# Patient Record
Sex: Female | Born: 1960 | Race: White | Hispanic: No | Marital: Married | Smoking: Never smoker
Health system: Southern US, Community
[De-identification: ages and names within clinical notes are randomized; demographics above are authoritative.]

## PROBLEM LIST (undated history)

## (undated) DIAGNOSIS — Z87898 Personal history of other specified conditions: Secondary | ICD-10-CM

## (undated) DIAGNOSIS — D219 Benign neoplasm of connective and other soft tissue, unspecified: Secondary | ICD-10-CM

## (undated) DIAGNOSIS — E079 Disorder of thyroid, unspecified: Secondary | ICD-10-CM

## (undated) DIAGNOSIS — R32 Unspecified urinary incontinence: Secondary | ICD-10-CM

## (undated) DIAGNOSIS — N393 Stress incontinence (female) (male): Secondary | ICD-10-CM

## (undated) DIAGNOSIS — E785 Hyperlipidemia, unspecified: Secondary | ICD-10-CM

## (undated) DIAGNOSIS — R7303 Prediabetes: Secondary | ICD-10-CM

## (undated) DIAGNOSIS — G9332 Myalgic encephalomyelitis/chronic fatigue syndrome: Secondary | ICD-10-CM

## (undated) DIAGNOSIS — R112 Nausea with vomiting, unspecified: Secondary | ICD-10-CM

## (undated) DIAGNOSIS — Z973 Presence of spectacles and contact lenses: Secondary | ICD-10-CM

## (undated) DIAGNOSIS — E7800 Pure hypercholesterolemia, unspecified: Secondary | ICD-10-CM

## (undated) DIAGNOSIS — E039 Hypothyroidism, unspecified: Secondary | ICD-10-CM

## (undated) DIAGNOSIS — K589 Irritable bowel syndrome without diarrhea: Secondary | ICD-10-CM

## (undated) DIAGNOSIS — M797 Fibromyalgia: Secondary | ICD-10-CM

## (undated) DIAGNOSIS — N8110 Cystocele, unspecified: Secondary | ICD-10-CM

## (undated) DIAGNOSIS — N8185 Cervical stump prolapse: Secondary | ICD-10-CM

## (undated) DIAGNOSIS — Z86018 Personal history of other benign neoplasm: Secondary | ICD-10-CM

## (undated) DIAGNOSIS — Z9889 Other specified postprocedural states: Secondary | ICD-10-CM

## (undated) DIAGNOSIS — N809 Endometriosis, unspecified: Secondary | ICD-10-CM

## (undated) DIAGNOSIS — R5382 Chronic fatigue, unspecified: Secondary | ICD-10-CM

## (undated) HISTORY — DX: Endometriosis, unspecified: N80.9

## (undated) HISTORY — PX: TUBAL LIGATION: SHX77

## (undated) HISTORY — DX: Pure hypercholesterolemia, unspecified: E78.00

## (undated) HISTORY — DX: Prediabetes: R73.03

## (undated) HISTORY — PX: HYSTEROSCOPY: SHX211

## (undated) HISTORY — PX: CHOLECYSTECTOMY: SHX55

## (undated) HISTORY — DX: Benign neoplasm of connective and other soft tissue, unspecified: D21.9

## (undated) HISTORY — DX: Unspecified urinary incontinence: R32

## (undated) HISTORY — PX: ABDOMINAL HYSTERECTOMY: SHX81

---

## 1988-05-02 HISTORY — PX: LAPAROSCOPIC TUBAL LIGATION: SHX1937

## 1988-05-02 HISTORY — PX: TUBAL LIGATION: SHX77

## 1992-05-02 HISTORY — PX: LAPAROSCOPIC CHOLECYSTECTOMY: SUR755

## 2002-05-02 HISTORY — PX: BILATERAL SALPINGECTOMY: SHX5743

## 2002-05-02 HISTORY — PX: SALPINGECTOMY: SHX328

## 2003-01-15 HISTORY — PX: PELVIC LAPAROSCOPY: SHX162

## 2005-05-02 HISTORY — PX: OTHER SURGICAL HISTORY: SHX169

## 2005-05-02 HISTORY — PX: DILATION AND CURETTAGE OF UTERUS: SHX78

## 2005-05-02 HISTORY — PX: APPENDECTOMY: SHX54

## 2005-05-02 HISTORY — PX: ENDOMETRIAL ABLATION: SHX621

## 2005-07-29 HISTORY — PX: HYSTEROSCOPY WITH NOVASURE: SHX5574

## 2005-07-29 HISTORY — PX: ENDOMETRIAL ABLATION: SHX621

## 2007-05-03 HISTORY — PX: LAPAROSCOPIC SUPRACERVICAL HYSTERECTOMY: SUR797

## 2007-05-03 HISTORY — PX: PELVIC LAPAROSCOPY: SHX162

## 2007-05-03 HISTORY — PX: ABDOMINAL HYSTERECTOMY: SHX81

## 2007-08-17 HISTORY — PX: DILATATION & CURETTAGE/HYSTEROSCOPY WITH MYOSURE: SHX6511

## 2007-08-17 HISTORY — PX: OTHER SURGICAL HISTORY: SHX169

## 2007-12-21 HISTORY — PX: LAPAROSCOPIC SUPRACERVICAL HYSTERECTOMY: SUR797

## 2012-06-02 HISTORY — PX: BREAST SURGERY: SHX581

## 2012-06-02 HISTORY — PX: BREAST REDUCTION SURGERY: SHX8

## 2018-04-02 ENCOUNTER — Other Ambulatory Visit: Payer: Self-pay

## 2018-04-02 ENCOUNTER — Emergency Department (HOSPITAL_COMMUNITY)
Admission: EM | Admit: 2018-04-02 | Discharge: 2018-04-02 | Disposition: A | Discharge status: Home / Self Care | Payer: BLUE CROSS/BLUE SHIELD | Attending: Emergency Medicine | Admitting: Emergency Medicine

## 2018-04-02 ENCOUNTER — Emergency Department (HOSPITAL_COMMUNITY): Payer: BLUE CROSS/BLUE SHIELD

## 2018-04-02 ENCOUNTER — Encounter (HOSPITAL_COMMUNITY): Payer: Self-pay | Admitting: *Deleted

## 2018-04-02 DIAGNOSIS — W010XXA Fall on same level from slipping, tripping and stumbling without subsequent striking against object, initial encounter: Secondary | ICD-10-CM | POA: Diagnosis not present

## 2018-04-02 DIAGNOSIS — Y9301 Activity, walking, marching and hiking: Secondary | ICD-10-CM | POA: Diagnosis not present

## 2018-04-02 DIAGNOSIS — Y999 Unspecified external cause status: Secondary | ICD-10-CM | POA: Diagnosis not present

## 2018-04-02 DIAGNOSIS — Y929 Unspecified place or not applicable: Secondary | ICD-10-CM | POA: Diagnosis not present

## 2018-04-02 DIAGNOSIS — S8002XA Contusion of left knee, initial encounter: Secondary | ICD-10-CM | POA: Diagnosis not present

## 2018-04-02 DIAGNOSIS — S8992XA Unspecified injury of left lower leg, initial encounter: Secondary | ICD-10-CM | POA: Diagnosis present

## 2018-04-02 MED ORDER — NAPROXEN 500 MG PO TABS: 500.0000 mg | ORAL_TABLET | Freq: Two times a day (BID) | ORAL | 0 refills | Status: DC

## 2018-04-02 NOTE — ED Triage Notes (Signed)
Patient fell last Wednesday tripping over a brick in the garage falling left knee cap. Patient feels she keeps injuring the left knee.  Patient has noted increased swelling today.

## 2018-04-02 NOTE — Discharge Instructions (Signed)
Your x-ray showed no signs of broken bones, you may use the knee immobilizer as needed for comfort, please take the Naprosyn as prescribed, ice, elevate and follow-up within 1 week at the family doctor's office.

## 2018-04-02 NOTE — ED Provider Notes (Signed)
[facility] EMERGENCY DEPARTMENT Provider Note   CSN: [id number] Arrival date & time: 04/02/18  2107     History   Chief Complaint Chief Complaint  Patient presents with  . Fall    HPI Valerie Vasquez is a 57 y.o. female.  HPI  The patient is a pleasant 57 year old female, history of hypothyroidism, she presents to the hospital approximately 5 days after having a fall where she accidentally tripped and landed on her left patella, she had acute onset of pain but has been ambulatory on this area since then.  She has had a couple of other minor injuries to the area since and is worried that it may be broken.  She is still walking on it but is noting some swelling, some bruising, denies any other injuries.  Symptoms are persistent, gradually worsening  History reviewed. No pertinent past medical history.  There are no active problems to display for this patient.   History reviewed. No pertinent surgical history.   OB History   None      Home Medications    Prior to Admission medications   Medication Sig Start Date End Date Taking? Authorizing Provider  naproxen (NAPROSYN) 500 MG tablet Take 1 tablet (500 mg total) by mouth 2 (two) times daily with a meal. 04/02/18   Noemi Chapel, MD    Family History History reviewed. No pertinent family history.  Social History Social History   Tobacco Use  . Smoking status: Never Smoker  . Smokeless tobacco: Never Used  Substance Use Topics  . Alcohol use: Not Currently  . Drug use: Never     Allergies   Codeine; Diazepam; and Morphine   Review of Systems Review of Systems  Skin: Negative for wound.  Neurological: Negative for weakness and numbness.     Physical Exam Updated Vital Signs BP (!) 145/90 (BP Location: Right Arm)   Pulse 82   Temp 98.1 F (36.7 C) (Oral)   Resp 12   Ht 1.549 m (5\' 1" )   Wt 87.1 kg   SpO2 99%   BMI 36.28 kg/m   Physical Exam  Constitutional: She appears well-developed and  well-nourished. No distress.  HENT:  Head: Normocephalic and atraumatic.  Mouth/Throat: Oropharynx is clear and moist. No oropharyngeal exudate.  Eyes: Pupils are equal, round, and reactive to light. Conjunctivae and EOM are normal. Right eye exhibits no discharge. Left eye exhibits no discharge. No scleral icterus.  Neck: Normal range of motion. Neck supple. No JVD present. No thyromegaly present.  Cardiovascular: Normal rate, regular rhythm, normal heart sounds and intact distal pulses. Exam reveals no gallop and no friction rub.  No murmur heard. Pulmonary/Chest: Effort normal and breath sounds normal. No respiratory distress. She has no wheezes. She has no rales.  Abdominal: Soft. Bowel sounds are normal. She exhibits no distension and no mass. There is no tenderness.  Musculoskeletal: Normal range of motion. She exhibits tenderness. She exhibits no edema.  The patient is able to fully flex and extend at the left knee, there is some tenderness over the distal inferior patella and the patellar tendon but she is able to straight leg raise without any difficulty.  She can bear weight, she has good stability of the knee without any anterior or posterior laxity.  Lymphadenopathy:    She has no cervical adenopathy.  Neurological: She is alert. Coordination normal.  Skin: Skin is warm and dry. No rash noted. No erythema.  Psychiatric: She has a normal mood and affect.  Her behavior is normal.  Nursing note and vitals reviewed.    ED Treatments / Results  Labs (all labs ordered are listed, but only abnormal results are displayed) Labs Reviewed - No data to display  EKG None  Radiology Dg Knee Complete 4 Views Left  Result Date: 04/02/2018 CLINICAL DATA:  Left knee pain after fall last week. EXAM: LEFT KNEE - COMPLETE 4+ VIEW COMPARISON:  None. FINDINGS: No evidence of fracture, dislocation, or joint effusion. No evidence of arthropathy or other focal bone abnormality. Soft tissues are  unremarkable. IMPRESSION: Negative. Electronically Signed   By: Marijo Conception, M.D.   On: 04/02/2018 21:48    Procedures Procedures (including critical care time)  Medications Ordered in ED Medications - No data to display   Initial Impression / Assessment and Plan / ED Course  I have reviewed the triage vital signs and the nursing notes.  Pertinent labs & imaging results that were available during my care of the patient were reviewed by me and considered in my medical decision making (see chart for details).     The patient is suffered a contusion of her knee, there does not appear to be any acute fractures on my interpretation of the x-ray.  I have personally looked at these images.  Radiology pending, recommended NSAIDs ice and knee immobilizer, patient agreeable.  She [facility]  Final Clinical Impressions(s) / ED Diagnoses   Final diagnoses:  Contusion of left knee, initial encounter    ED Discharge Orders         Ordered    naproxen (NAPROSYN) 500 MG tablet  2 times daily with meals     04/02/18 2153           Noemi Chapel, MD 04/02/18 2154

## 2018-06-27 ENCOUNTER — Other Ambulatory Visit: Payer: Self-pay

## 2018-06-27 ENCOUNTER — Encounter (HOSPITAL_COMMUNITY): Payer: Self-pay

## 2018-06-27 DIAGNOSIS — L03211 Cellulitis of face: Secondary | ICD-10-CM | POA: Diagnosis not present

## 2018-06-27 DIAGNOSIS — Z9049 Acquired absence of other specified parts of digestive tract: Secondary | ICD-10-CM | POA: Insufficient documentation

## 2018-06-27 DIAGNOSIS — R220 Localized swelling, mass and lump, head: Secondary | ICD-10-CM | POA: Diagnosis present

## 2018-06-27 DIAGNOSIS — Z79899 Other long term (current) drug therapy: Secondary | ICD-10-CM | POA: Diagnosis not present

## 2018-06-27 NOTE — ED Triage Notes (Signed)
Pt with complaints of facial swelling on left side of mouth. Pt with sore on left side of the mouth. Pt states came up this morning. Pt denies any fever.

## 2018-06-28 ENCOUNTER — Emergency Department (HOSPITAL_COMMUNITY)
Admission: EM | Admit: 2018-06-28 | Discharge: 2018-06-28 | Disposition: A | Discharge status: Home / Self Care | Payer: BLUE CROSS/BLUE SHIELD | Attending: Emergency Medicine | Admitting: Emergency Medicine

## 2018-06-28 ENCOUNTER — Emergency Department (HOSPITAL_COMMUNITY): Payer: BLUE CROSS/BLUE SHIELD

## 2018-06-28 DIAGNOSIS — L03211 Cellulitis of face: Secondary | ICD-10-CM

## 2018-06-28 HISTORY — DX: Chronic fatigue, unspecified: R53.82

## 2018-06-28 HISTORY — DX: Irritable bowel syndrome, unspecified: K58.9

## 2018-06-28 HISTORY — DX: Myalgic encephalomyelitis/chronic fatigue syndrome: G93.32

## 2018-06-28 HISTORY — DX: Disorder of thyroid, unspecified: E07.9

## 2018-06-28 HISTORY — DX: Fibromyalgia: M79.7

## 2018-06-28 LAB — BASIC METABOLIC PANEL
Anion gap: 9 (ref 5–15)
BUN: 8 mg/dL (ref 6–20)
CHLORIDE: 105 mmol/L (ref 98–111)
CO2: 24 mmol/L (ref 22–32)
Calcium: 8.9 mg/dL (ref 8.9–10.3)
Creatinine, Ser: 0.8 mg/dL (ref 0.44–1.00)
GFR calc Af Amer: >60 mL/min (ref >60)
GFR calc non Af Amer: >60 mL/min (ref >60)
Glucose, Bld: 127 mg/dL — ABNORMAL HIGH (ref 70–99)
Potassium: 3.4 mmol/L — ABNORMAL LOW (ref 3.5–5.1)
Sodium: 138 mmol/L (ref 135–145)

## 2018-06-28 LAB — CBC WITH DIFFERENTIAL/PLATELET
Abs Immature Granulocytes: 0.09 10*3/uL — ABNORMAL HIGH (ref 0.00–0.07)
Basophils Absolute: 0 10*3/uL (ref 0.0–0.1)
Basophils Relative: 0 %
Eosinophils Absolute: 0 10*3/uL (ref 0.0–0.5)
Eosinophils Relative: 0 %
HCT: 44.5 % (ref 36.0–46.0)
Hemoglobin: 14.3 g/dL (ref 12.0–15.0)
Immature Granulocytes: 0 %
LYMPHS ABS: 2.6 10*3/uL (ref 0.7–4.0)
Lymphocytes Relative: 13 %
MCH: 28.6 pg (ref 26.0–34.0)
MCHC: 32.1 g/dL (ref 30.0–36.0)
MCV: 89 fL (ref 80.0–100.0)
MONOS PCT: 4 %
Monocytes Absolute: 0.9 10*3/uL (ref 0.1–1.0)
Neutro Abs: 17 10*3/uL — ABNORMAL HIGH (ref 1.7–7.7)
Neutrophils Relative %: 83 %
Platelets: 290 10*3/uL (ref 150–400)
RBC: 5 MIL/uL (ref 3.87–5.11)
RDW: 12.3 % (ref 11.5–15.5)
WBC: 20.7 10*3/uL — ABNORMAL HIGH (ref 4.0–10.5)
nRBC: 0 % (ref 0.0–0.2)

## 2018-06-28 LAB — LACTIC ACID, PLASMA: Lactic Acid, Venous: 0.9 mmol/L (ref 0.5–1.9)

## 2018-06-28 MED ORDER — ONDANSETRON HCL 4 MG/2ML IJ SOLN
4.0000 mg | Freq: Once | INTRAMUSCULAR | Status: AC
  Administered 2018-06-28: 4 mg via INTRAVENOUS
  Filled 2018-06-28: qty 2

## 2018-06-28 MED ORDER — OXYCODONE-ACETAMINOPHEN 5-325 MG PO TABS: 2.0000 | ORAL_TABLET | ORAL | 0 refills | Status: DC | PRN

## 2018-06-28 MED ORDER — CLINDAMYCIN PHOSPHATE 900 MG/50ML IV SOLN
900.0000 mg | Freq: Once | INTRAVENOUS | Status: AC
  Administered 2018-06-28: 900 mg via INTRAVENOUS
  Filled 2018-06-28: qty 50

## 2018-06-28 MED ORDER — FENTANYL CITRATE (PF) 100 MCG/2ML IJ SOLN
100.0000 ug | Freq: Once | INTRAMUSCULAR | Status: AC
  Administered 2018-06-28: 100 ug via INTRAVENOUS
  Filled 2018-06-28: qty 2

## 2018-06-28 MED ORDER — LIDOCAINE HCL (PF) 2 % IJ SOLN
INTRAMUSCULAR | Status: AC
  Filled 2018-06-28: qty 20

## 2018-06-28 MED ORDER — SODIUM CHLORIDE 0.9 % IV BOLUS
500.0000 mL | Freq: Once | INTRAVENOUS | Status: AC
  Administered 2018-06-28: 500 mL via INTRAVENOUS

## 2018-06-28 MED ORDER — IOHEXOL 300 MG/ML  SOLN
75.0000 mL | Freq: Once | INTRAMUSCULAR | Status: AC | PRN
  Administered 2018-06-28: 75 mL via INTRAVENOUS

## 2018-06-28 MED ORDER — KETOROLAC TROMETHAMINE 30 MG/ML IJ SOLN
15.0000 mg | Freq: Once | INTRAMUSCULAR | Status: AC
  Administered 2018-06-28: 15 mg via INTRAVENOUS
  Filled 2018-06-28: qty 1

## 2018-06-28 MED ORDER — CLINDAMYCIN HCL 150 MG PO CAPS: 300.0000 mg | ORAL_CAPSULE | Freq: Four times a day (QID) | ORAL | 0 refills | Status: DC

## 2018-06-28 MED ORDER — LIDOCAINE HCL (PF) 2 % IJ SOLN: 5.0000 mL | Freq: Once | INTRAMUSCULAR | Status: DC

## 2018-06-28 MED ORDER — OXYCODONE-ACETAMINOPHEN 5-325 MG PO TABS
1.0000 | ORAL_TABLET | Freq: Once | ORAL | Status: AC
  Administered 2018-06-28: 1 via ORAL
  Filled 2018-06-28: qty 1

## 2018-06-28 NOTE — Discharge Instructions (Signed)
Take the antibiotic as prescribed.  Return to the ER if symptoms are not improving after 24 to 36 hours.  Return to the ER at any time if you develop a high fever or significant worsening of swelling and redness, especially if it goes near the eye.

## 2018-06-28 NOTE — ED Notes (Signed)
Pt ambulatory to waiting room. Pt verbalized understanding of discharge instructions.   

## 2018-06-28 NOTE — ED Provider Notes (Signed)
[facility] EMERGENCY DEPARTMENT Provider Note   CSN: [id number] Arrival date & time: 06/27/18  1818    History   Chief Complaint Chief Complaint  Patient presents with  . Facial Swelling    HPI Valerie Vasquez is a 58 y.o. female.     Patient presents to the emergency department for evaluation of left-sided facial swelling.  Patient reports that she noticed the blindness next to the corner of her mouth yesterday.  She did try to squeeze it at some point and since then the area has become red and swollen, causing her a great deal of pain.  Nothing has drained.  She has not had any fever.     Past Medical History:  Diagnosis Date  . Chronic fatigue syndrome   . Fibromyalgia   . IBS (irritable bowel syndrome)   . Thyroid disease     There are no active problems to display for this patient.   Past Surgical History:  Procedure Laterality Date  . ABDOMINAL HYSTERECTOMY    . CHOLECYSTECTOMY    . HYSTEROSCOPY    . TUBAL LIGATION       OB History   No obstetric history on file.      Home Medications    Prior to Admission medications   Medication Sig Start Date End Date Taking? Authorizing Provider  clindamycin (CLEOCIN) 150 MG capsule Take 2 capsules (300 mg total) by mouth 4 (four) times daily. 06/28/18   Orpah Greek, MD  naproxen (NAPROSYN) 500 MG tablet Take 1 tablet (500 mg total) by mouth 2 (two) times daily with a meal. 04/02/18   Noemi Chapel, MD  oxyCODONE-acetaminophen (PERCOCET) 5-325 MG tablet Take 2 tablets by mouth every 4 (four) hours as needed. 06/28/18   Orpah Greek, MD    Family History No family history on file.  Social History Social History   Tobacco Use  . Smoking status: Never Smoker  . Smokeless tobacco: Never Used  Substance Use Topics  . Alcohol use: Not Currently  . Drug use: Never     Allergies   Codeine; Diazepam; and Morphine   Review of Systems Review of Systems  Skin: Positive for wound.  All other  systems reviewed and are negative.    Physical Exam Updated Vital Signs BP 114/73 (BP Location: Right Arm)   Pulse 92   Temp 98.5 F (36.9 C) (Temporal)   Resp 16   Ht 5\' 1"  (1.549 m)   Wt 87.5 kg   SpO2 98%   BMI 36.47 kg/m   Physical Exam Vitals signs and nursing note reviewed.  Constitutional:      General: She is not in acute distress.    Appearance: Normal appearance. She is well-developed.  HENT:     Head: Normocephalic.     Right Ear: Hearing normal.     Left Ear: Hearing normal.     Nose: Nose normal.  Eyes:     Conjunctiva/sclera: Conjunctivae normal.     Pupils: Pupils are equal, round, and reactive to light.  Neck:     Musculoskeletal: Normal range of motion and neck supple.  Cardiovascular:     Rate and Rhythm: Regular rhythm.     Heart sounds: S1 normal and S2 normal. No murmur. No friction rub. No gallop.   Pulmonary:     Effort: Pulmonary effort is normal. No respiratory distress.     Breath sounds: Normal breath sounds.  Chest:     Chest wall: No tenderness.  Abdominal:     General: Bowel sounds are normal.     Palpations: Abdomen is soft.     Tenderness: There is no abdominal tenderness. There is no guarding or rebound. Negative signs include Murphy's sign and McBurney's sign.     Hernia: No hernia is present.  Musculoskeletal: Normal range of motion.  Skin:    General: Skin is warm and dry.     Findings: Erythema present. No rash.     Comments: Small scab next to the left corner of her mouth with surrounding erythema, induration, tenderness.  No fluctuance noted.  Neurological:     Mental Status: She is alert and oriented to person, place, and time.     GCS: GCS eye subscore is 4. GCS verbal subscore is 5. GCS motor subscore is 6.     Cranial Nerves: No cranial nerve deficit.     Sensory: No sensory deficit.     Coordination: Coordination normal.  Psychiatric:        Speech: Speech normal.        Behavior: Behavior normal.        Thought  Content: Thought content normal.      ED Treatments / Results  Labs (all labs ordered are listed, but only abnormal results are displayed) Labs Reviewed  CBC WITH DIFFERENTIAL/PLATELET - Abnormal; Notable for the following components:      Result Value   WBC 20.7 (*)    Neutro Abs 17.0 (*)    Abs Immature Granulocytes 0.09 (*)    All other components within normal limits  BASIC METABOLIC PANEL - Abnormal; Notable for the following components:   Potassium 3.4 (*)    Glucose, Bld 127 (*)    All other components within normal limits  LACTIC ACID, PLASMA    EKG None  Radiology Ct Maxillofacial W Contrast  Result Date: 06/28/2018 CLINICAL DATA:  LEFT facial swelling, mouth sore. EXAM: CT MAXILLOFACIAL WITH CONTRAST TECHNIQUE: Multidetector CT imaging of the maxillofacial structures was performed with intravenous contrast. Multiplanar CT image reconstructions were also generated. CONTRAST:  69mL OMNIPAQUE IOHEXOL 300 MG/ML  SOLN COMPARISON:  None. FINDINGS: OSSEOUS: No acute facial fracture. The mandible is intact, the condyles are located. No destructive bony lesions. No CT findings of acute dental pathology. Severe atlantodental osteoarthrosis. ORBITS: Ocular globes and orbital contents are normal. SINUSES: Paranasal sinuses are well aerated. Intact nasal septum is midline. Mastoid aircells are well aerated. SOFT TISSUES: LEFT premalar/periorbital soft tissue swelling with subcutaneous fat stranding, extending to lower face. 7 x 13 x 15 mm superficial RIGHT enhance and fluid collection standing to skin surface at the level of the mandible body. Thickened LEFT platysma and subcutaneous fat stranding extending to upper neck. LIMITED INTRACRANIAL: Empty sella. IMPRESSION: 1. 7 x 13 x 15 mm abscess within the superficial soft tissues of the LEFT lower face. LEFT facial cellulitis extending to the periorbital soft tissues without postseptal extent. Electronically Signed   By: Elon Alas  M.D.   On: 06/28/2018 04:17    Procedures .Marland KitchenIncision and Drainage Date/Time: 06/28/2018 7:08 AM Performed by: Orpah Greek, MD Authorized by: Orpah Greek, MD   Consent:    Consent obtained:  Verbal   Consent given by:  Patient   Risks discussed:  Bleeding, incomplete drainage and pain Universal protocol:    Procedure explained and questions answered to patient or proxy's satisfaction: yes     Site/side marked: yes     Immediately prior to procedure a time  out was called: yes     Patient identity confirmed:  Verbally with patient Location:    Type:  Abscess   Size:  1cm   Location:  Head   Head location:  Face Pre-procedure details:    Skin preparation:  Betadine and antiseptic wash Anesthesia (see MAR for exact dosages):    Anesthesia method:  Local infiltration   Local anesthetic:  Lidocaine 1% w/o epi Procedure type:    Complexity:  Simple Procedure details:    Needle aspiration: yes   Post-procedure details:    Patient tolerance of procedure:  Tolerated well, no immediate complications Comments:     No pus encountered with needle aspiration, no incision made   (including critical care time)  Medications Ordered in ED Medications  lidocaine (XYLOCAINE) 2 % injection 5 mL (has no administration in time range)  ketorolac (TORADOL) 30 MG/ML injection 15 mg (15 mg Intravenous Given 06/28/18 0233)  sodium chloride 0.9 % bolus 500 mL (0 mLs Intravenous Stopped 06/28/18 0550)  clindamycin (CLEOCIN) IVPB 900 mg (0 mg Intravenous Stopped 06/28/18 0336)  fentaNYL (SUBLIMAZE) injection 100 mcg (100 mcg Intravenous Given 06/28/18 0233)  ondansetron (ZOFRAN) injection 4 mg (4 mg Intravenous Given 06/28/18 0234)  iohexol (OMNIPAQUE) 300 MG/ML solution 75 mL (75 mLs Intravenous Contrast Given 06/28/18 0347)  oxyCODONE-acetaminophen (PERCOCET/ROXICET) 5-325 MG per tablet 1 tablet (1 tablet Oral Given 06/28/18 0557)     Initial Impression / Assessment and Plan / ED  Course  I have reviewed the triage vital signs and the nursing notes.  Pertinent labs & imaging results that were available during my care of the patient were reviewed by me and considered in my medical decision making (see chart for details).        Patient presents to the emergency department for evaluation of facial swelling.  Symptoms began 1 day ago.  She has moderate amount of erythema with induration noted on the left perioral area and malar area.  CT scan does show a very small area of possible fluid collection, less than 1 cm in diameter.  Attempts to aspirate with a needle were unsuccessful.  Patient administered IV clindamycin.  Feel the patient can be treated as an outpatient with oral antibiotics but was given instructions to return to the ER immediately if she has any significant worsening for admission and continued IV antibiotics.  Final Clinical Impressions(s) / ED Diagnoses   Final diagnoses:  Facial cellulitis    ED Discharge Orders         Ordered    clindamycin (CLEOCIN) 150 MG capsule  4 times daily,   Status:  Discontinued     06/28/18 0530    oxyCODONE-acetaminophen (PERCOCET) 5-325 MG tablet  Every 4 hours PRN,   Status:  Discontinued     06/28/18 0530    oxyCODONE-acetaminophen (PERCOCET) 5-325 MG tablet  Every 4 hours PRN     06/28/18 0531    clindamycin (CLEOCIN) 150 MG capsule  4 times daily     06/28/18 0531           Orpah Greek, MD 06/28/18 [id number]

## 2019-10-28 ENCOUNTER — Encounter: Payer: Self-pay | Admitting: Obstetrics and Gynecology

## 2019-10-28 ENCOUNTER — Other Ambulatory Visit: Payer: Self-pay | Admitting: Obstetrics and Gynecology

## 2019-11-07 ENCOUNTER — Other Ambulatory Visit (HOSPITAL_COMMUNITY)
Admission: RE | Admit: 2019-11-07 | Discharge: 2019-11-07 | Disposition: A | Discharge status: Home / Self Care | Payer: 59 | Source: Ambulatory Visit | Attending: Obstetrics and Gynecology | Admitting: Obstetrics and Gynecology

## 2019-11-07 ENCOUNTER — Ambulatory Visit: Payer: 59 | Admitting: Obstetrics and Gynecology

## 2019-11-07 ENCOUNTER — Encounter: Payer: Self-pay | Admitting: Obstetrics and Gynecology

## 2019-11-07 ENCOUNTER — Other Ambulatory Visit: Payer: Self-pay

## 2019-11-07 VITALS — BP 138/80 | HR 64 | Resp 16 | Ht 62.0 in | Wt 166.0 lb

## 2019-11-07 DIAGNOSIS — Z01419 Encounter for gynecological examination (general) (routine) without abnormal findings: Secondary | ICD-10-CM | POA: Diagnosis not present

## 2019-11-07 NOTE — Progress Notes (Signed)
59 y.o. G26P2002 Married Caucasian female here for annual exam.    Had abnormal thermogram.  Bilateral breasts are always tender.   She has some chest pains.  Reports muscle spasms of her chest wall and her internal throat.   Taking ERT, compounded.   States her bladder has fallen.  Some difficulty controlling her bladder.  Able to void adequately. Difficulty to make it to the bathroom sometimes.   Patient has been to a hormone and fibromyalgia center in [place], [place] but is now living in [place].  Needs to establish care here.   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   PCP:  None   No LMP recorded. Patient has had a hysterectomy.           Sexually active: Yes.    The current method of family planning is status post hysterectomy.    Exercising: Yes.    Treadmill 2-3x/week Smoker:  no  Health Maintenance: Pap:  2017 normal History of abnormal Pap: Yes, hx of cryotherapy in the 90's--paps normal since MMG: 10/2015 normal in [place] pt. Has appt.next week for MMG--pt. States she has been doing tomography for past 3 years--LAST ONE ABNORMAL.  She has an appointment at [facility].  Colonoscopy:  NEVER BMD: ?years ago she thinks  Result ?Osteopenia TDaP: 11/2004--Pt. declines Gardasil:   no HIV:no Hep C:no Screening Labs:  -------     reports that she has never smoked. She has never used smokeless tobacco. She reports previous alcohol use. She reports that she does not use drugs.  Past Medical History:  Diagnosis Date  . Chronic fatigue syndrome   . Endometriosis   . Fibroid   . Fibromyalgia   . IBS (irritable bowel syndrome)   . Thyroid disease    Hypothyroidism  . Urinary incontinence     Past Surgical History:  Procedure Laterality Date  . ABDOMINAL HYSTERECTOMY  2009   [facility]--ovaries removed--[place]  . APPENDECTOMY  2007   at same time of D & C   . BILATERAL SALPINGECTOMY  2004   [place]--"full of blood"  . bowel ressection  2007   along with D & C, hysteroscopy and  appendectomy--[place]  . BREAST SURGERY  06/2012   breast reduction--[place]  . CHOLECYSTECTOMY    . DILATION AND CURETTAGE OF UTERUS  2007   with hysteroscopy--[place]  . ENDOMETRIAL ABLATION  2007  . HYSTEROSCOPY    . PELVIC LAPAROSCOPY  2009   removed fibroid, cleared fluid from uterus--[place]  . TUBAL LIGATION  1990   [place]    Current Outpatient Medications  Medication Sig Dispense Refill  . ARMOUR THYROID 30 MG tablet Take 30 mg by mouth daily.    . cyclobenzaprine (FLEXERIL) 10 MG tablet Take 10 mg by mouth 3 (three) times daily as needed for muscle spasms.    . metaxalone (SKELAXIN) 800 MG tablet Take 1 tablet by mouth as needed.    . NONFORMULARY OR COMPOUNDED ITEM Bi-Est 80/20 cream 1mg /41ml combined with Progesterone 191m 30mg   Apply 6 nights per week    . NONFORMULARY OR COMPOUNDED ITEM Liothyronine SR 4mcg Takes daily    . oxyCODONE-acetaminophen (PERCOCET) 5-325 MG tablet Take 2 tablets by mouth every 4 (four) hours as needed. 10 tablet 0  . sertraline (ZOLOFT) 50 MG tablet Take 1 tablet by mouth daily.    . SOOLANTRA 1 % CREA Apply 1 application topically as needed.     No current facility-administered medications for this visit.    Family History  Problem Relation Age of Onset  . Hypertension Mother   . Atrial fibrillation Mother   . Hypertension Brother   . Stroke Maternal Grandmother   . Diabetes Paternal Grandmother     Review of Systems  All other systems reviewed and are negative.   Exam:   BP 138/80   Pulse 64   Resp 16   Ht 5\' 2"  (1.575 m)   Wt 166 lb (75.3 kg)   BMI 30.36 kg/m     General appearance: alert, cooperative and appears stated age Head: normocephalic, without obvious abnormality, atraumatic Neck: no adenopathy, supple, symmetrical, trachea midline and thyroid normal to inspection and palpation Lungs: clear to auscultation bilaterally Breasts: consistent with prior surgery, no masses or tenderness, No nipple retraction or dimpling, No nipple discharge  or bleeding, No axillary adenopathy Heart: regular rate and rhythm Abdomen: soft, non-tender; no masses, no organomegaly Extremities: extremities normal, atraumatic, no cyanosis or edema Skin: skin color, texture, turgor normal. No rashes or lesions Lymph nodes: cervical, supraclavicular, and axillary nodes normal. Neurologic: grossly normal  Pelvic: External genitalia:  no lesions              No abnormal inguinal nodes palpated.              Urethra:  normal appearing urethra with no masses, tenderness or lesions              Bartholins and Skenes: normal                 Vagina: normal appearing vagina with normal color and discharge, no lesions              Cervix: no lesions              Pap taken: Yes.   Bimanual Exam:  Uterus:   Uterus absent.  First degree cervical prolapse.  Good bladder and posterior vaginal wall support.               Adnexa: no mass, fullness, tenderness              Rectal exam: Yes.  .  Confirms.              Anus:  normal sphincter tone, no lesions  Chaperone was present for exam.  Assessment:   Well woman visit with normal exam. Status post supracervical hysterectomy/BSO Status post bowel resection.  Remote hx of abnormal paps and cryotherapy.  Possible osteopenia.  ERT.  Hx abnormal thermogram.   Plan: Mammogram screening discussed.  [place] for screening mammogram at [place].  Self breast awareness reviewed. Pap and HR HPV as above. Guidelines for Calcium, Vitamin D, regular exercise program including cardiovascular and weight bearing exercise. She declines colon cancer screening. We discussed CDC as a resource for her if she has vaccine questions.  She may pursue care for her hormones at [facility].  She will also establish care with a PCP.  Will get op reports and pathology reports of her hysterectomy, endometrial ablation, and bowel resection surgery.  Follow up annually and prn.   After visit summary provided.

## 2019-11-07 NOTE — Patient Instructions (Signed)

## 2019-11-08 LAB — CYTOLOGY - PAP
Comment: NEGATIVE
Diagnosis: NEGATIVE
High risk HPV: NEGATIVE

## 2019-11-10 ENCOUNTER — Other Ambulatory Visit: Payer: Self-pay

## 2019-11-10 ENCOUNTER — Encounter (HOSPITAL_COMMUNITY): Payer: Self-pay | Admitting: Emergency Medicine

## 2019-11-10 ENCOUNTER — Emergency Department (HOSPITAL_COMMUNITY)
Admission: EM | Admit: 2019-11-10 | Discharge: 2019-11-11 | Disposition: A | Discharge status: Home / Self Care | Payer: 59 | Attending: Emergency Medicine | Admitting: Emergency Medicine

## 2019-11-10 ENCOUNTER — Emergency Department (HOSPITAL_COMMUNITY): Payer: 59

## 2019-11-10 DIAGNOSIS — E039 Hypothyroidism, unspecified: Secondary | ICD-10-CM | POA: Insufficient documentation

## 2019-11-10 DIAGNOSIS — R0789 Other chest pain: Secondary | ICD-10-CM | POA: Insufficient documentation

## 2019-11-10 DIAGNOSIS — Z79899 Other long term (current) drug therapy: Secondary | ICD-10-CM | POA: Diagnosis not present

## 2019-11-10 LAB — CBC
HCT: 41.4 % (ref 36.0–46.0)
Hemoglobin: 13.8 g/dL (ref 12.0–15.0)
MCH: 30.5 pg (ref 26.0–34.0)
MCHC: 33.3 g/dL (ref 30.0–36.0)
MCV: 91.4 fL (ref 80.0–100.0)
Platelets: 286 10*3/uL (ref 150–400)
RBC: 4.53 MIL/uL (ref 3.87–5.11)
RDW: 11.9 % (ref 11.5–15.5)
WBC: 5.4 10*3/uL (ref 4.0–10.5)
nRBC: 0 % (ref 0.0–0.2)

## 2019-11-10 LAB — URINALYSIS, ROUTINE W REFLEX MICROSCOPIC
Bacteria, UA: NONE SEEN
Bilirubin Urine: NEGATIVE
Glucose, UA: NEGATIVE mg/dL
Hgb urine dipstick: NEGATIVE
Ketones, ur: NEGATIVE mg/dL
Nitrite: NEGATIVE
Protein, ur: NEGATIVE mg/dL
Specific Gravity, Urine: 1.006 (ref 1.005–1.030)
pH: 7 (ref 5.0–8.0)

## 2019-11-10 LAB — BASIC METABOLIC PANEL
Anion gap: 7 (ref 5–15)
BUN: 15 mg/dL (ref 6–20)
CO2: 28 mmol/L (ref 22–32)
Calcium: 9.1 mg/dL (ref 8.9–10.3)
Chloride: 104 mmol/L (ref 98–111)
Creatinine, Ser: 0.81 mg/dL (ref 0.44–1.00)
GFR calc Af Amer: >60 mL/min (ref >60)
GFR calc non Af Amer: >60 mL/min (ref >60)
Glucose, Bld: 108 mg/dL — ABNORMAL HIGH (ref 70–99)
Potassium: 3.9 mmol/L (ref 3.5–5.1)
Sodium: 139 mmol/L (ref 135–145)

## 2019-11-10 LAB — TROPONIN I (HIGH SENSITIVITY)
Troponin I (High Sensitivity): 6 ng/L (ref <18)
Troponin I (High Sensitivity): 7 ng/L (ref <18)

## 2019-11-10 NOTE — ED Notes (Signed)
Pt returned from xray

## 2019-11-10 NOTE — ED Notes (Signed)
Patient transported to X-ray 

## 2019-11-10 NOTE — ED Triage Notes (Signed)
Pt c/o chest pain for several months, pt states she thought it was a pulled muscle. Pt also c/o SHOB, neck pain, headache, and fatigue that started tonight.

## 2019-11-11 MED ORDER — KETOROLAC TROMETHAMINE 30 MG/ML IJ SOLN
30.0000 mg | Freq: Once | INTRAMUSCULAR | Status: AC
  Administered 2019-11-11: 30 mg via INTRAVENOUS
  Filled 2019-11-11: qty 1

## 2019-11-11 NOTE — Discharge Instructions (Addendum)
Use ice and heat for comfort.  Take your Skelaxin for your chest muscle soreness.  You could also take it with ibuprofen 600 mg 4 times a day OR Aleve 2 tablets twice a day.  Recheck if you get fever, cough, or struggle to breathe.  Try to drink plenty of fluids especially sports drinks during the heat of the summer so you do not get dehydrated.

## 2019-11-11 NOTE — ED Provider Notes (Signed)
[facility] EMERGENCY DEPARTMENT Provider Note   CSN: [id number] Arrival date & time: 11/10/19  2005   Time seen 12:30 AM History Chief Complaint  Patient presents with  . Chest Pain    Valerie Vasquez is a 59 y.o. female.  HPI   Patient reports she has fibromyalgia and is treated by her doctor in [place], when she moved here a few years ago she did not get a local doctor. She states she has muscle spasms across her upper chest that last 30 to 45 minutes at a time and started a few months ago. She states she takes Skelaxin and it goes away. She states she gets the chest pain daily. She describes the pain is sharp and then it becomes achy. Nothing she does makes it feel worse such as coughing or movement. The muscle relaxers usually help. She denies shortness of breath, cough, fever, nausea, vomiting, or diaphoresis. 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 She states tonight at church she "felt awful". She states she was shaking and felt like she was going to pass out. She felt weak in her arms and legs. She states it started about 5:30 PM and it continues now. She states it makes her feel short of breath, she is having the chest pain that tonight is more constant. She states is different from a panic attack. She feels like she is having muscle spasms in her throat. She states she did have some diaphoresis tonight but it was not a hot flash. She is on thyroid supplement for low thyroid and states her last thyroid test was in February and they were okay. She states her mother and her brother have atrial fibrillation but have not had heart disease. She denies any recent change in her medications.  PCP  In [place]  Past Medical History:  Diagnosis Date  . Chronic fatigue syndrome   . Endometriosis   . Fibroid   . Fibromyalgia   . IBS (irritable bowel syndrome)   . Thyroid disease    Hypothyroidism  . Urinary incontinence       There are no problems to display for this patient.   Past Surgical History:  Procedure Laterality Date  . ABDOMINAL HYSTERECTOMY  2009   TVH--ovaries removed--[place]  . APPENDECTOMY  2007   at same time of D & C   . BILATERAL SALPINGECTOMY  2004   [place]--"full of blood"  . bowel ressection  2007   along with D & C, hysteroscopy and appendectomy--[place]  . BREAST SURGERY  06/2012   breast reduction--[place]  . CHOLECYSTECTOMY    . DILATION AND CURETTAGE OF UTERUS  2007   with hysteroscopy--[place]  . ENDOMETRIAL ABLATION  2007  . HYSTEROSCOPY    . PELVIC LAPAROSCOPY  2009   removed fibroid, cleared fluid from uterus--[place]  . [place]     OB History    Gravida  2   Para  2   Term  2   Preterm      AB      Living  2     SAB      TAB      Ectopic      Multiple      Live Births              Family History  Problem Relation Age of Onset  . Hypertension  Mother   . Atrial fibrillation Mother   . Hypertension Brother   . Stroke Maternal Grandmother   . Diabetes Paternal Grandmother     Social History   Tobacco Use  . Smoking status: Never Smoker  . Smokeless tobacco: Never Used  Vaping Use  . Vaping Use: Never used  Substance Use Topics  . Alcohol use: Not Currently  . 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 Authorizing Provider  ARMOUR THYROID 30 MG tablet Take 30 mg by mouth daily. 10/22/19   [provider]  cyclobenzaprine (FLEXERIL) 10 MG tablet Take 10 mg by mouth 3 (three) times daily as needed for muscle spasms.    [provider]  metaxalone (SKELAXIN) 800 MG tablet Take 1 tablet by mouth as needed. 02/20/12   [provider]  NONFORMULARY OR COMPOUNDED ITEM Bi-Est 80/20 cream 1mg /20ml combined with Progesterone 147m 30mg   Apply 6 nights per week    [provider]  NONFORMULARY OR COMPOUNDED ITEM  Liothyronine SR 35mcg Takes daily    [provider]  oxyCODONE-acetaminophen (PERCOCET) 5-325 MG tablet Take 2 tablets by mouth every 4 (four) hours as needed. 06/28/18   Orpah Greek, MD  sertraline (ZOLOFT) 50 MG tablet Take 1 tablet by mouth daily. 02/22/12   [provider]  SOOLANTRA 1 % CREA Apply 1 application topically as needed. 07/09/19   [provider]    Allergies    Codeine, Diazepam, Morphine, and Topiramate  Review of Systems   Review of Systems  All other systems reviewed and are negative.   Physical Exam Updated Vital Signs BP 132/80 (BP Location: Left Arm)   Pulse 78   Temp 98.4 F (36.9 C) (Oral)   Resp 16   Ht 5\' 2"  (1.575 m)   Wt 73.9 kg   SpO2 99%   BMI 29.81 kg/m   Physical Exam Vitals and nursing note reviewed.  Constitutional:      General: She is not in acute distress.    Appearance: Normal appearance. She is normal weight. She is not ill-appearing.  HENT:     Head: Normocephalic and atraumatic.     Right Ear: External ear normal.     Left Ear: External ear normal.  Eyes:     Extraocular Movements: Extraocular movements intact.     Conjunctiva/sclera: Conjunctivae normal.     Pupils: Pupils are equal, round, and reactive to light.  Cardiovascular:     Rate and Rhythm: Normal rate and regular rhythm.     Pulses: Normal pulses.     Heart sounds: No murmur heard.   Pulmonary:     Effort: Pulmonary effort is normal. No respiratory distress.     Breath sounds: Normal breath sounds.  Chest:     Chest wall: Tenderness present.       Comments: Patient is very tender to even light touch. Musculoskeletal:        General: Normal range of motion.     Cervical back: Normal range of motion.  Skin:    General: Skin is warm and dry.     Coloration: Skin is not jaundiced.  Neurological:     General: No focal deficit present.     Mental Status: She is alert and oriented to person, place, and time.     Cranial  Nerves: No cranial nerve deficit.  Psychiatric:  Mood and Affect: Affect is flat.        Behavior: Behavior normal.        Thought Content: Thought content normal.     ED Results / Procedures / Treatments   Labs (all labs ordered are listed, but only abnormal results are displayed) Results for orders placed or performed during the hospital encounter of 74/12/87  Basic metabolic panel  Result Value Ref Range   Sodium 139 135 - 145 mmol/L   Potassium 3.9 3.5 - 5.1 mmol/L   Chloride 104 98 - 111 mmol/L   CO2 28 22 - 32 mmol/L   Glucose, Bld 108 (H) 70 - 99 mg/dL   BUN 15 6 - 20 mg/dL   Creatinine, Ser 0.81 0.44 - 1.00 mg/dL   Calcium 9.1 8.9 - 10.3 mg/dL   GFR calc non Af Amer >60 >60 mL/min   GFR calc Af Amer >60 >60 mL/min   Anion gap 7 5 - 15  CBC  Result Value Ref Range   WBC 5.4 4.0 - 10.5 K/uL   RBC 4.53 3.87 - 5.11 MIL/uL   Hemoglobin 13.8 12.0 - 15.0 g/dL   HCT 41.4 36 - 46 %   MCV 91.4 80.0 - 100.0 fL   MCH 30.5 26.0 - 34.0 pg   MCHC 33.3 30.0 - 36.0 g/dL   RDW 11.9 11.5 - 15.5 %   Platelets 286 150 - 400 K/uL   nRBC 0.0 0.0 - 0.2 %  Urinalysis, Routine w reflex microscopic  Result Value Ref Range   Color, Urine STRAW (A) YELLOW   APPearance CLEAR CLEAR   Specific Gravity, Urine 1.006 1.005 - 1.030   pH 7.0 5.0 - 8.0   Glucose, UA NEGATIVE NEGATIVE mg/dL   Hgb urine dipstick NEGATIVE NEGATIVE   Bilirubin Urine NEGATIVE NEGATIVE   Ketones, ur NEGATIVE NEGATIVE mg/dL   Protein, ur NEGATIVE NEGATIVE mg/dL   Nitrite NEGATIVE NEGATIVE   Leukocytes,Ua TRACE (A) NEGATIVE   RBC / HPF 0-5 0 - 5 RBC/hpf   WBC, UA 0-5 0 - 5 WBC/hpf   Bacteria, UA NONE SEEN NONE SEEN   Squamous Epithelial / LPF 0-5 0 - 5  Troponin I (High Sensitivity)  Result Value Ref Range   Troponin I (High Sensitivity) 6 <18 ng/L  Troponin I (High Sensitivity)  Result Value Ref Range   Troponin I (High Sensitivity) 7 <18 ng/L   Laboratory interpretation all normal except contaminated  urine, nonfasting hyperglycemia    EKG EKG Interpretation  Date/Time:  Sunday November 10 2019 20:19:59 EDT Ventricular Rate:  90 PR Interval:  138 QRS Duration: 72 QT Interval:  364 QTC Calculation: 445 R Axis:   15 Text Interpretation: Normal sinus rhythm Cannot rule out Anterior infarct , age undetermined Electrode noise No old tracing to compare Confirmed by Rolland Porter [id number]) on 11/10/2019 11:09:09 PM   Radiology DG Chest 2 View  Result Date: 11/10/2019 CLINICAL DATA:  59 year old female with persistent chest pain. Neck pain, headache, shortness of breath and fatigue. EXAM: CHEST - 2 VIEW COMPARISON:  None. FINDINGS: Normal lung volumes and mediastinal contours. Visualized tracheal air column is within normal limits. Both lungs appear clear. No pneumothorax or pleural effusion. No acute osseous abnormality identified. Cholecystectomy clips in the right upper quadrant. Negative visible bowel gas pattern. IMPRESSION: Negative.  No acute cardiopulmonary abnormality. Electronically Signed   By: Genevie Ann M.D.   On: 11/10/2019 21:12    Procedures Procedures (including critical care time)  Medications Ordered in ED Medications  ketorolac (TORADOL) 30 MG/ML injection 30 mg (30 mg Intravenous Given 11/11/19 0105)    ED Course  I have reviewed the triage vital signs and the nursing notes.  Pertinent labs & imaging results that were available during my care of the patient were reviewed by me and considered in my medical decision making (see chart for details).    MDM Rules/Calculators/A&P                           We discussed her test results, if she had not had a recent thyroid check I would have done that however she had it done just a few months ago and it was normal. She was given Toradol in her IV for her chest wall pain.  At time of discharge she states the Toradol had helped her chest pain much.  I offered to give her a muscle relaxer in the ER or she can take her Skelaxin at  home and she elected to take her Skelaxin at home.  She was reassured that she was not having a heart attack which was her main concern and she was discharged home to use ice and heat for comfort and continue her Skelaxin.  Final Clinical Impression(s) / ED Diagnoses Final diagnoses:  Chest wall pain    Rx / DC Orders ED Discharge Orders    None    OTC ibuprofen OR aleve   Plan discharge  Rolland Porter, MD, Barbette Or, MD 11/11/19 [phone]

## 2019-11-12 ENCOUNTER — Encounter: Payer: Self-pay | Admitting: Obstetrics and Gynecology

## 2019-12-05 ENCOUNTER — Encounter: Payer: Self-pay | Admitting: Obstetrics and Gynecology

## 2020-02-03 ENCOUNTER — Encounter: Payer: Self-pay | Admitting: Obstetrics and Gynecology

## 2020-09-21 ENCOUNTER — Ambulatory Visit (INDEPENDENT_AMBULATORY_CARE_PROVIDER_SITE_OTHER): Payer: 59 | Admitting: Otolaryngology

## 2020-09-21 ENCOUNTER — Other Ambulatory Visit: Payer: Self-pay

## 2020-09-21 DIAGNOSIS — H9313 Tinnitus, bilateral: Secondary | ICD-10-CM

## 2020-09-21 NOTE — Progress Notes (Signed)
HPI: Valerie Vasquez is a 60 y.o. female who presents for evaluation of tinnitus that she is noted in her ears that she has had for years.  She describes a high-pitched ringing in her ears that she has noted for several years.  She has not sure to what caused this.  She denies loud noise exposure.  She has not had a hearing test performed.  She presently feels like the right ear might be worse than the left.  She has not noted any significant hearing problems  Past Medical History:  Diagnosis Date  . Chronic fatigue syndrome   . Endometriosis   . Fibroid   . Fibromyalgia   . IBS (irritable bowel syndrome)   . Thyroid disease    Hypothyroidism  . Urinary incontinence    Past Surgical History:  Procedure Laterality Date  . APPENDECTOMY  2007   at same time of D & C   . bowel ressection  2007   along with D & C, hysteroscopy and appendectomy--[place]  . BREAST SURGERY  06/2012   breast reduction--[place]  . CHOLECYSTECTOMY    . ENDOMETRIAL ABLATION  07/29/2005   hysteroscopy, dilation and curettage, Novasure endometrial ablation, laparoscopic lysis of adhesions and drainage of left ovarian cyst - [place]  . HYSTEROSCOPIC MYOMECTOMY  08/17/2007   Dilation and curettage - [place]  . LAPAROSCOPIC SUPRACERVICAL HYSTERECTOMY  2009   right salpingo-oophorectoy, left oophorectomy - [place]  . PELVIC LAPAROSCOPY  2009   removed fibroid, cleared fluid from uterus--[place]  . SALPINGECTOMY  2004   laparoscopic left salpinectomy, partial right salpingectomy, lysis of adhesions between omentum and right ubilical libament, dx hystereoscopy with dilation and curettage  . [place]   Social History   Socioeconomic History  . Marital status: Married    Spouse name: Not on file  . Number of children: Not on file  . Years of education: Not on file  . Highest education level: Not on file  Occupational History  . Not on file  Tobacco Use  . Smoking status: Never Smoker  .  Smokeless tobacco: Never Used  Vaping Use  . Vaping Use: Never used  Substance and Sexual Activity  . Alcohol use: Not Currently  . Drug use: Never  . Sexual activity: Yes    Birth control/protection: Surgical    Comment: TVH  Other Topics Concern  . Not on file  Social History Narrative  . Not on file   Social Determinants of Health   Financial Resource Strain: Not on file  Food Insecurity: Not on file  Transportation Needs: Not on file  Physical Activity: Not on file  Stress: Not on file  Social Connections: Not on file   Family History  Problem Relation Age of Onset  . Hypertension Mother   . Atrial fibrillation Mother   . Hypertension Brother   . Stroke Maternal Grandmother   . Diabetes Paternal Grandmother    Allergies  Allergen Reactions  . Codeine     High doses cause chest pain   . Diazepam     High doses cause chest pain  . Morphine     High doses cause chest pain  . Topiramate    Prior to Admission medications   Medication Sig Start Date End Date Taking? Authorizing Provider  ARMOUR THYROID 30 MG tablet Take 30 mg by mouth daily. 10/22/19   [provider]  cyclobenzaprine (FLEXERIL) 10 MG tablet Take 10 mg by  mouth 3 (three) times daily as needed for muscle spasms.    [provider]  metaxalone (SKELAXIN) 800 MG tablet Take 1 tablet by mouth as needed. 02/20/12   [provider]  NONFORMULARY OR COMPOUNDED ITEM Bi-Est 80/20 cream 1mg /24ml combined with Progesterone 164m 30mg   Apply 6 nights per week    [provider]  NONFORMULARY OR COMPOUNDED ITEM Liothyronine SR 60mcg Takes daily    [provider]  oxyCODONE-acetaminophen (PERCOCET) 5-325 MG tablet Take 2 tablets by mouth every 4 (four) hours as needed. 06/28/18   Orpah Greek, MD  sertraline (ZOLOFT) 50 MG tablet Take 1 tablet by mouth daily. 02/22/12   [provider]  SOOLANTRA 1 % CREA Apply 1 application topically as needed. 07/09/19    [provider]     Positive ROS: Otherwise negative  All other systems have been reviewed and were otherwise negative with the exception of those mentioned in the HPI and as above.  Physical Exam: Constitutional: Alert, well-appearing, no acute distress Ears: External ears without lesions or tenderness. Ear canals are clear bilaterally with intact, clear TMs.  Auscultation of the ears revealed no objective tinnitus. Nasal: External nose without lesions. Septum is relatively midline. Clear nasal passages bilaterally. Oral: Lips and gums without lesions. Tongue and palate mucosa without lesions. Posterior oropharynx clear. Neck: No palpable adenopathy or masses Respiratory: Breathing comfortably  Skin: No facial/neck lesions or rash noted.  Audiologic testing in the office today demonstrated essentially normal hearing in the mid and lower frequencies with a slight upper frequency sensorineural hearing loss in both ears at 8000 frequency slightly worse on the right side.  SRT's were 20 DB on the right and 15 DB on the left.  She had type A tympanograms bilaterally.  Procedures  Assessment: Tinnitus.  Questionable etiology although she does have a mild high-frequency sensorineural hearing loss.  Plan: Briefly reviewed with her concerning limited treatment options for tinnitus.  I discussed with her concerning using masking noise when the tinnitus is bothersome. Also gave her samples of Lipo flavonoid to try as this is beneficial in some people with tinnitus. Also gave her information on the [facility] tinnitus clinic. Recommended obtaining annual hearing test to follow-up on the tinnitus and minimal hearing loss noted on audiologic testing.  Radene Journey, MD

## 2020-11-03 NOTE — Progress Notes (Signed)
60 y.o. G78P2002 Married Caucasian female here for annual exam.    Has Rx for estrogen, progesterone and testosterone compounded and Armour thyroid medication from Fibromyalgia center in [place].   Patient is feeling prolapse in the vagina. She had first degree cervical prolapse last year.  Pushes it back in.  No problems voiding.  Has incontinence with urgency for a while.  Sometimes leaks with a cough, laugh, sneeze.  Wears a pad.  Has fecal incontinence occasionally, few times a year. This is dependent on food choices or stress.  Gluten and sugar can prompt this.   Labs with PCP.  Has slightly elevated cholesterol.  PCP:   Dr. Grandville Silos, [place]  No LMP recorded. Patient has had a hysterectomy.           Sexually active: Yes.    The current method of family planning is none.    Exercising: Yes.    Home exercise. Smoker:  no  Health Maintenance: Pap:  11-07-19 normal Neg HPV History of abnormal Pap:  no MMG:  11/15/19 normal - BI-RADS 1.  Has appointment at [facility].  Colonoscopy:  never.  Did IFOB with PCP - negative per patient.  BMD: ? unsure  Result  N/A TDaP:  2006 Gardasil:   no HIV:neg Hep C:neg Screening Labs:  Hb today: PCP, Urine today: PCP   reports that she has never smoked. She has never used smokeless tobacco. She reports previous alcohol use. She reports that she does not use drugs.  Past Medical History:  Diagnosis Date   Chronic fatigue syndrome    Endometriosis    Fibroid    Fibromyalgia    IBS (irritable bowel syndrome)    Thyroid disease    Hypothyroidism   Urinary incontinence     Past Surgical History:  Procedure Laterality Date   APPENDECTOMY  2007   at same time of D & C    bowel ressection  2007   along with D & C, hysteroscopy and appendectomy--[place]   BREAST SURGERY  06/2012   breast reduction--[place]   CHOLECYSTECTOMY     ENDOMETRIAL ABLATION  07/29/2005   hysteroscopy, dilation and curettage, Novasure endometrial ablation, laparoscopic  lysis of adhesions and drainage of left ovarian cyst - [place]   HYSTEROSCOPIC MYOMECTOMY  08/17/2007   Dilation and curettage - [place] SUPRACERVICAL HYSTERECTOMY  2009   right salpingo-oophorectoy, left oophorectomy - [place]   PELVIC LAPAROSCOPY  2009   removed fibroid, cleared fluid from uterus--[place]   SALPINGECTOMY  2004   laparoscopic left salpinectomy, partial right salpingectomy, lysis of adhesions between omentum and right ubilical libament, dx hystereoscopy with dilation and curettage   TUBAL LIGATION  1990   [place]    Current Outpatient Medications  Medication Sig Dispense Refill   ARMOUR THYROID 30 MG tablet Take 30 mg by mouth daily.     cyclobenzaprine (FLEXERIL) 10 MG tablet Take 10 mg by mouth 3 (three) times daily as needed for muscle spasms.     metaxalone (SKELAXIN) 800 MG tablet Take 1 tablet by mouth as needed.     NONFORMULARY OR COMPOUNDED ITEM Bi-Est 80/20 cream 1mg /31ml combined with Progesterone 169m 30mg   Apply 6 nights per week     NONFORMULARY OR COMPOUNDED ITEM Liothyronine SR 70mcg Takes daily     NONFORMULARY OR COMPOUNDED ITEM Testosterone cream     oxyCODONE-acetaminophen (PERCOCET) 5-325 MG tablet Take 2 tablets by mouth every 4 (four) hours as needed. 10 tablet 0  SOOLANTRA 1 % CREA Apply 1 application topically as needed.     zolpidem (AMBIEN CR) 6.25 MG CR tablet Take 6.25 mg by mouth at bedtime as needed.     No current facility-administered medications for this visit.    Family History  Problem Relation Age of Onset   Hypertension Mother    Atrial fibrillation Mother    Hypertension Brother    Stroke Maternal Grandmother    Diabetes Paternal Grandmother     Review of Systems  See HPI.  Exam:   BP 120/78 (BP Location: Left Arm, Patient Position: Sitting, Cuff Size: Normal)   Pulse 69   Ht 5' 1.5" (1.562 m)   Wt 170 lb (77.1 kg)   SpO2 97%   BMI 31.60 kg/m     General appearance: alert, cooperative and  appears stated age Head: normocephalic, without obvious abnormality, atraumatic Neck: no adenopathy, supple, symmetrical, trachea midline and thyroid normal to inspection and palpation Lungs: clear to auscultation bilaterally Breasts: normal appearance, no masses or tenderness, No nipple retraction or dimpling, No nipple discharge or bleeding, No axillary adenopathy Heart: regular rate and rhythm Abdomen: soft, non-tender; no masses, no organomegaly Extremities: extremities normal, atraumatic, no cyanosis or edema Skin: skin color, texture, turgor normal. No rashes or lesions Lymph nodes: cervical, supraclavicular, and axillary nodes normal. Neurologic: grossly normal  Pelvic: External genitalia:  no lesions              No abnormal inguinal nodes palpated.              Urethra:  normal appearing urethra with no masses, tenderness or lesions              Bartholins and Skenes: normal                 Vagina: normal appearing vagina with normal color and discharge, no lesions              Cervix: no lesions.  Second degree cervical and bladder prolapse.  No significant rectocele.              Pap taken: no. Bimanual Exam:  Uterus:  absent              Adnexa: no mass, fullness, tenderness              Rectal exam: yes.Confirms.              Anus:  normal sphincter tone, no lesions  Chaperone was present for exam.  Assessment:   Well woman visit with gynecologic exam. Status post supracervical hysterectomy/BSO. Use of compounded HRT and testosterone.  Cervical and bladder prolapse, progressive. Mixed incontinence. Status post bowel resection. Remote hx of abnormal paps and cryotherapy. Possible osteopenia.  Plan: Mammogram screening discussed. Self breast awareness reviewed. Pap and HR HPV 2025. Guidelines for Calcium, Vitamin D, regular exercise program including cardiovascular and weight bearing exercise. Options for prolapse discussed - pessary use, pelvic floor therapy and  surgical care.  Patient will return for pessary fitting and consider surgery for the future. If patient has osteopenia, I would recommend a bone density every 2 years to screen for osteoporosis.  Follow up annually and prn.   After visit summary provided.

## 2020-11-10 ENCOUNTER — Encounter: Payer: Self-pay | Admitting: Obstetrics and Gynecology

## 2020-11-10 ENCOUNTER — Other Ambulatory Visit: Payer: Self-pay

## 2020-11-10 ENCOUNTER — Ambulatory Visit (INDEPENDENT_AMBULATORY_CARE_PROVIDER_SITE_OTHER): Payer: 59 | Admitting: Obstetrics and Gynecology

## 2020-11-10 VITALS — BP 120/78 | HR 69 | Ht 61.5 in | Wt 170.0 lb

## 2020-11-10 DIAGNOSIS — N8110 Cystocele, unspecified: Secondary | ICD-10-CM

## 2020-11-10 DIAGNOSIS — Z01419 Encounter for gynecological examination (general) (routine) without abnormal findings: Secondary | ICD-10-CM | POA: Diagnosis not present

## 2020-11-10 DIAGNOSIS — N3946 Mixed incontinence: Secondary | ICD-10-CM

## 2020-11-10 DIAGNOSIS — N8185 Cervical stump prolapse: Secondary | ICD-10-CM | POA: Diagnosis not present

## 2020-11-10 NOTE — Patient Instructions (Signed)

## 2020-11-14 DIAGNOSIS — N8185 Cervical stump prolapse: Secondary | ICD-10-CM | POA: Insufficient documentation

## 2020-11-14 DIAGNOSIS — N3946 Mixed incontinence: Secondary | ICD-10-CM | POA: Insufficient documentation

## 2020-11-14 DIAGNOSIS — N8110 Cystocele, unspecified: Secondary | ICD-10-CM | POA: Insufficient documentation

## 2020-11-16 ENCOUNTER — Encounter: Payer: Self-pay | Admitting: Obstetrics and Gynecology

## 2020-11-24 NOTE — Progress Notes (Signed)
GYNECOLOGY  VISIT   HPI: 60 y.o.   Married  Caucasian  female   G2P2002 with No LMP recorded. Patient has had a hysterectomy.   here for pessary fitting.  GYNECOLOGIC HISTORY: No LMP recorded. Patient has had a hysterectomy. 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/2020   Female bladder prolapse 11/14/2020   Mixed incontinence 11/14/2020    Past Medical History:  Diagnosis Date   Chronic fatigue syndrome    Endometriosis    Fibroid    Fibromyalgia    IBS (irritable bowel syndrome)    Thyroid disease    Hypothyroidism   Urinary incontinence     Past Surgical History:  Procedure Laterality Date   APPENDECTOMY  2007   at same time of D & C    bowel ressection  2007   along with D & C, hysteroscopy and appendectomy--[place]   BREAST SURGERY  06/2012   breast reduction--[place]   CHOLECYSTECTOMY     ENDOMETRIAL ABLATION  07/29/2005   hysteroscopy, dilation and curettage, Novasure endometrial ablation, laparoscopic lysis of adhesions and drainage of left ovarian cyst - [place]   HYSTEROSCOPIC MYOMECTOMY  08/17/2007   Dilation and curettage - [place] SUPRACERVICAL HYSTERECTOMY  2009   right salpingo-oophorectoy, left oophorectomy - [place]   PELVIC LAPAROSCOPY  2009   removed fibroid, cleared fluid from uterus--[place]   SALPINGECTOMY  2004   laparoscopic left salpinectomy, partial right salpingectomy, lysis of adhesions between omentum and right ubilical libament, dx hystereoscopy with dilation and curettage   TUBAL LIGATION  1990   [place]    Current Outpatient  Medications  Medication Sig Dispense Refill   ARMOUR THYROID 30 MG tablet Take 30 mg by mouth daily.     cyclobenzaprine (FLEXERIL) 10 MG tablet Take 10 mg by mouth 3 (three) times daily as needed for muscle spasms.     metaxalone (SKELAXIN) 800 MG tablet Take 1 tablet by mouth as needed.     NONFORMULARY OR COMPOUNDED ITEM Bi-Est 80/20 cream '1mg'$ /534m combined with Progesterone 1431m'30mg'P$   Apply 6 nights per week     NONFORMULARY OR COMPOUNDED ITEM Liothyronine SR 34m18mTakes daily     NONFORMULARY OR COMPOUNDED ITEM Testosterone cream     oxyCODONE-acetaminophen (PERCOCET) 5-325 MG tablet Take 2 tablets by mouth every 4 (four) hours as needed. 10 tablet 0   SOOLANTRA 1 % CREA Apply 1 application topically as needed.     zolpidem (AMBIEN CR) 6.25 MG CR tablet Take 6.25 mg by mouth at bedtime as needed.     No current facility-administered medications for this visit.     ALLERGIES: Codeine, Diazepam, Morphine, and Topiramate  Family History  Problem Relation Age of Onset   Hypertension Mother    Atrial fibrillation Mother    Hypertension  Brother    Stroke Maternal Grandmother    Diabetes Paternal Grandmother     Social History   Socioeconomic History   Marital status: Married    Spouse name: Not on file   Number of children: Not on file   Years of education: Not on file   Highest education level: Not on file  Occupational History   Not on file  Tobacco Use   Smoking status: Never   Smokeless tobacco: Never  Vaping Use   Vaping Use: Never used  Substance and Sexual Activity   Alcohol use: Not Currently   Drug use: Never   Sexual activity: Yes    Birth control/protection: Surgical    Comment: TVH  Other Topics Concern   Not on file  Social History Narrative   Not on file   Social Determinants of Health   Financial Resource Strain: Not on file  Food Insecurity: Not on file  Transportation Needs: Not on file  Physical Activity: Not on file  Stress: Not on file   Social Connections: Not on file  Intimate Partner Violence: Not on file    Review of Systems  All other systems reviewed and are negative.  PHYSICAL EXAMINATION:    BP 118/70   Ht 5' 1.5" (1.562 m)   Wt 170 lb (77.1 kg)   BMI 31.60 kg/m     General appearance: alert, cooperative and appears stated age  Pelvic: External genitalia:  no lesions              Urethra:  normal appearing urethra with no masses, tenderness or lesions              Bartholins and Skenes: normal                 Vagina: normal appearing vagina with normal color and discharge, no lesions              Cervix: no lesions.  Second degree cervical stump and bladder prolapse.                 Bimanual Exam:  Uterus:  normal size, contour, position, consistency, mobility, non-tender              Adnexa: no mass, fullness, tenderness              #3 ring with support comfortable, able to toilet and do maneuvers like walking, squatting, etc. And maintained pessary in place.  #4 ring with support bulky. #2 incontinence dish felt like "a lot was going on in the vagina." #3 incontinence dish was painful.  Chaperone was present for exam:  Estill Bamberg CMA.   ASSESSMENT  Cervical stump prolapse.  Bladder prolapse.   PLAN  Return for placement of #3 ring with support.   25 min total time was spent for this patient encounter, including preparation, face-to-face counseling with the patient, coordination of care, and documentation of the encounter.

## 2020-12-01 ENCOUNTER — Encounter: Payer: Self-pay | Admitting: Obstetrics and Gynecology

## 2020-12-01 ENCOUNTER — Ambulatory Visit (INDEPENDENT_AMBULATORY_CARE_PROVIDER_SITE_OTHER): Payer: 59 | Admitting: Obstetrics and Gynecology

## 2020-12-01 ENCOUNTER — Other Ambulatory Visit: Payer: Self-pay

## 2020-12-01 VITALS — BP 118/70 | Ht 61.5 in | Wt 170.0 lb

## 2020-12-01 DIAGNOSIS — N8185 Cervical stump prolapse: Secondary | ICD-10-CM

## 2020-12-01 DIAGNOSIS — N8110 Cystocele, unspecified: Secondary | ICD-10-CM

## 2020-12-02 ENCOUNTER — Telehealth: Payer: Self-pay | Admitting: Obstetrics and Gynecology

## 2020-12-02 NOTE — Telephone Encounter (Signed)
My patient needs a #3 ring with support pessary.   If this is available in supply, please contact patient to make an appointment for placement.   If it is not available, please order this for her.   Thank you.

## 2020-12-03 NOTE — Telephone Encounter (Signed)
Call to patient x2, line busy.   MyChart message to patient requesting return call to office to schedule OV.   Pessary to [employer].   Routing to [employer].   Encounter closed.

## 2020-12-09 ENCOUNTER — Other Ambulatory Visit: Payer: Self-pay

## 2020-12-09 ENCOUNTER — Ambulatory Visit (INDEPENDENT_AMBULATORY_CARE_PROVIDER_SITE_OTHER): Payer: 59 | Admitting: Obstetrics and Gynecology

## 2020-12-09 ENCOUNTER — Encounter: Payer: Self-pay | Admitting: Obstetrics and Gynecology

## 2020-12-09 VITALS — BP 122/78 | HR 76 | Ht 61.5 in | Wt 170.0 lb

## 2020-12-09 DIAGNOSIS — N8110 Cystocele, unspecified: Secondary | ICD-10-CM

## 2020-12-09 DIAGNOSIS — N8185 Cervical stump prolapse: Secondary | ICD-10-CM

## 2020-12-09 NOTE — Patient Instructions (Signed)

## 2020-12-09 NOTE — Telephone Encounter (Signed)
I suspect the the bleeding is from the pessary rubbing against the cervix or the vaginal walls, which may be atrophic (thin) from menopause.   I recommend starting vaginal Estrace cream 1/2 gram per vagina at hs nightly for 2 weeks and then reducing the frequency to 1/2 gram per vagina at hs 2 - 3 times weekly.   Disp:  42.5 grams  RF:  2  The addition of the vaginal estrogen increases her overall estrogen exposure by very little.   I would recommend leaving the pessary out for the next two weeks while she is doing the initial treatment.   She will need to move her pessary recheck to about 3 - 4 weeks from now.

## 2020-12-09 NOTE — Progress Notes (Signed)
GYNECOLOGY  VISIT   HPI: 60 y.o.   Married  Caucasian  female   G2P2002 with No LMP recorded. Patient has had a hysterectomy.   here for pessary insertion.   Will receive a #3 ring with support which was already fitted.   GYNECOLOGIC HISTORY: No LMP recorded. Patient has had a hysterectomy. 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   Female bladder prolapse 11/14/2020   Mixed incontinence 11/14/2020    Past Medical History:  Diagnosis Date   Chronic fatigue syndrome    Endometriosis    Fibroid    Fibromyalgia    IBS (irritable bowel syndrome)    Thyroid disease    Hypothyroidism   Urinary incontinence     Past Surgical History:  Procedure Laterality Date   APPENDECTOMY  2007   at same time of D & C    bowel ressection  2007   along with D & C, hysteroscopy and appendectomy--[place]   BREAST SURGERY  06/2012   breast reduction--[place]   CHOLECYSTECTOMY     ENDOMETRIAL ABLATION  07/29/2005   hysteroscopy, dilation and curettage, Novasure endometrial ablation, laparoscopic lysis of adhesions and drainage of left ovarian cyst - [place]   HYSTEROSCOPIC MYOMECTOMY  08/17/2007   Dilation and curettage - [place] SUPRACERVICAL HYSTERECTOMY  2009   right salpingo-oophorectoy, left oophorectomy - [place]   PELVIC LAPAROSCOPY  2009   removed fibroid, cleared fluid from uterus--[place]   SALPINGECTOMY  2004   laparoscopic left salpinectomy, partial right salpingectomy, lysis of adhesions between omentum and right ubilical libament, dx hystereoscopy with dilation and  curettage   TUBAL LIGATION  1990   [place]    Current Outpatient Medications  Medication Sig Dispense Refill   ARMOUR THYROID 30 MG tablet Take 30 mg by mouth daily.     cyclobenzaprine (FLEXERIL) 10 MG tablet Take 10 mg by mouth 3 (three) times daily as needed for muscle spasms.     metaxalone (SKELAXIN) 800 MG tablet Take 1 tablet by mouth as needed.     NONFORMULARY OR COMPOUNDED ITEM Bi-Est 80/20 cream '1mg'$ /61m combined with Progesterone 14101m'30mg'm$   Apply 6 nights per week     NONFORMULARY OR COMPOUNDED ITEM Liothyronine SR 48m73mTakes daily     NONFORMULARY OR COMPOUNDED ITEM Testosterone cream     oxyCODONE-acetaminophen (PERCOCET) 5-325 MG tablet Take 2 tablets by mouth every 4 (four) hours as needed. 10 tablet 0   SOOLANTRA 1 % CREA Apply 1 application topically as needed.     zolpidem (AMBIEN CR) 6.25 MG CR tablet Take 6.25 mg by mouth at bedtime as needed.     No current facility-administered medications for this visit.     ALLERGIES: Codeine, Diazepam, Morphine, and Topiramate  Family History  Problem Relation Age  of Onset   Hypertension Mother    Atrial fibrillation Mother    Hypertension Brother    Stroke Maternal Grandmother    Diabetes Paternal Grandmother     Social History   Socioeconomic History   Marital status: Married    Spouse name: Not on file   Number of children: Not on file   Years of education: Not on file   Highest education level: Not on file  Occupational History   Not on file  Tobacco Use   Smoking status: Never   Smokeless tobacco: Never  Vaping Use   Vaping Use: Never used  Substance and Sexual Activity   Alcohol use: Not Currently   Drug use: Never   Sexual activity: Yes    Birth control/protection: Surgical    Comment: TVH  Other Topics Concern   Not on file  Social History Narrative   Not on file   Social Determinants of Health   Financial Resource Strain: Not on file  Food Insecurity: Not on file  Transportation Needs: Not  on file  Physical Activity: Not on file  Stress: Not on file  Social Connections: Not on file  Intimate Partner Violence: Not on file    Review of Systems  All other systems reviewed and are negative.  PHYSICAL EXAMINATION:    BP 122/78   Pulse 76   Ht 5' 1.5" (1.562 m)   Wt 170 lb (77.1 kg)   SpO2 98%   BMI 31.60 kg/m     General appearance: alert, cooperative and appears stated age   Pelvic: External genitalia:  no lesions                       Pessary #3 ring with support placed and patient able to maintain with maneuvers such as walking squatting, jumping, lifting the provider stool per her request.  She was able to place and remove the pessary.   Chaperone was present for exam:  Estill Bamberg, CMA  ASSESSMENT  Cervical stump prolapse.  Bladder prolapse.   PLAN  #3 ring with support from [employer], lot number [id number], expiration 01/01/20. Pessary care instructions reviewed verbally and in written form.  Fu in 1 - 2 weeks, sooner if needed.   An After Visit Summary was printed and given to the patient.  __23____ minutes face to face time of which over 50% was spent in counseling.

## 2020-12-10 ENCOUNTER — Telehealth: Payer: Self-pay

## 2020-12-10 MED ORDER — ESTRADIOL 0.1 MG/GM VA CREA
TOPICAL_CREAM | VAGINAL | 2 refills | Status: DC
Start: 2020-12-10 — End: 2021-11-25

## 2020-12-10 NOTE — Telephone Encounter (Signed)
Pharmacist called.  You prescribed Estrace Vaginal cream for patient today.   Pharmacist said he also compounds a Bi-Est 80/20 cream for her that contains progesterone. He was actually getting ready to make that for her and wanted to check with you before he did.  (Bi-Est 80/20 cream '1mg'$ /80m combined with Progesterone 1433m'30mg'Q$ )

## 2020-12-10 NOTE — Telephone Encounter (Signed)
Ok to fill the prescription for local vaginal Estrace cream.  Her other hormonal replacement therapy is prescribed by a different provider.

## 2020-12-11 NOTE — Telephone Encounter (Signed)
I called Valerie Vasquez and informed okay to fill vaginal estrace cream.

## 2020-12-17 ENCOUNTER — Ambulatory Visit: Payer: 59 | Admitting: Obstetrics and Gynecology

## 2021-01-13 ENCOUNTER — Ambulatory Visit: Payer: 59 | Admitting: Obstetrics and Gynecology

## 2021-11-09 NOTE — Progress Notes (Unsigned)
61 y.o. G57P2002 Married Caucasian female here for annual exam.    Pessary is out--not using currently. Takes the pessary out to have a bowel movement.  The pessary does fall out.   Not using vaginal estrogen cream.   Has IBS, constipation and diarrhea.  Now more loose bowel movements.  Fecal incontinence every couple of months when her stool is more loose.   Voiding well.  No leakage of urine.    Wears a pad or a liner.  No pain with intercourse.   Has been taking Cymbalta for fibromyalgia and has gained 20-30lbs. She's working on [employer].  Will see cardiology next week for squeezing chest pain.   PCP:  Stana Bunting, MD - [facility] in [place].  [facility].   No LMP recorded. Patient has had a hysterectomy.           Sexually active: No.   Occasionally active.  The current method of family planning is status post Tubal/hysterectomy.    Exercising: No.  The patient does not participate in regular exercise at present. Smoker:  no  Health Maintenance: Pap:  11-07-19 Neg:Neg HR HPV History of abnormal Pap:  no MMG:  11-16-20 Neg/Birads1--has appt. Colonoscopy:  NEVER. 09/2021 Neg cologuard BMD: Unsure  Result  n/a TDaP: PCP Gardasil:   n/a HIV: neg in past Hep C: neg in past Screening Labs:  PCP and [facility].   reports that she has never smoked. She has never used smokeless tobacco. She reports that she does not currently use alcohol. She reports that she does not use drugs.  Past Medical History:  Diagnosis Date   Chronic fatigue syndrome    Elevated cholesterol    Endometriosis    Fibroid    Fibromyalgia    IBS (irritable bowel syndrome)    Prediabetes    Thyroid disease    Hypothyroidism   Urinary incontinence     Past Surgical History:  Procedure Laterality Date   APPENDECTOMY  2007   at same time of D & C    bowel ressection  2007   along with D & C, hysteroscopy and appendectomy--[place]   BREAST SURGERY  06/2012   breast reduction--[place]    CHOLECYSTECTOMY     ENDOMETRIAL ABLATION  07/29/2005   hysteroscopy, dilation and curettage, Novasure endometrial ablation, laparoscopic lysis of adhesions and drainage of left ovarian cyst - [place]   HYSTEROSCOPIC MYOMECTOMY  08/17/2007   Dilation and curettage - [place] SUPRACERVICAL HYSTERECTOMY  2009   right salpingo-oophorectoy, left oophorectomy - [place]   PELVIC LAPAROSCOPY  2009   removed fibroid, cleared fluid from uterus--[place]   SALPINGECTOMY  2004   laparoscopic left salpinectomy, partial right salpingectomy, lysis of adhesions between omentum and right ubilical libament, dx hystereoscopy with dilation and curettage   TUBAL LIGATION  1990   [place]    Current Outpatient Medications  Medication Sig Dispense Refill   ARMOUR THYROID 30 MG tablet Take 30 mg by mouth daily.     cyclobenzaprine (FLEXERIL) 10 MG tablet Take 10 mg by mouth 3 (three) times daily as needed for muscle spasms.     DULoxetine (CYMBALTA) 30 MG capsule Take 30 mg by mouth daily.     estradiol (ESTRACE VAGINAL) 0.1 MG/GM vaginal cream Insert 1/2 gram intravaginally hs x 2 weeks then reduce dose to 1/2 gram intravaginally hs 2-3 x weekly. 42.5 g 2   metaxalone (SKELAXIN) 800 MG tablet Take 1 tablet by mouth as needed.  NONFORMULARY OR COMPOUNDED ITEM Bi-Est 80/20 cream '1mg'$ /13m combined with Progesterone 1410m'30mg'U$   Apply 6 nights per week     NONFORMULARY OR COMPOUNDED ITEM Liothyronine SR 36m46mTakes daily     NONFORMULARY OR COMPOUNDED ITEM Testosterone cream     OVER THE COUNTER MEDICATION Multiple supplements     rosuvastatin (CRESTOR) 5 MG tablet Take 5 mg by mouth every other day.     SOOLANTRA 1 % CREA Apply 1 application topically as needed.     No current facility-administered medications for this visit.    Family History  Problem Relation Age of Onset   Hypertension Mother    Atrial fibrillation Mother    Hypertension Brother    Stroke Maternal Grandmother     Diabetes Paternal Grandmother     Review of Systems  Genitourinary:        Vaginal prolapse is worse  All other systems reviewed and are negative.   Exam:   BP 130/80   Pulse 98   Ht 5' 1.5" (1.562 m)   Wt 191 lb (86.6 kg)   SpO2 97%   BMI 35.50 kg/m     General appearance: alert, cooperative and appears stated age Head: normocephalic, without obvious abnormality, atraumatic Neck: no adenopathy, supple, symmetrical, trachea midline and thyroid normal to inspection and palpation Lungs: clear to auscultation bilaterally Breasts: normal appearance, no masses or tenderness, No nipple retraction or dimpling, No nipple discharge or bleeding, No axillary adenopathy Heart: regular rate and rhythm Abdomen: soft, non-tender; no masses, no organomegaly Extremities: extremities normal, atraumatic, no cyanosis or edema Skin: skin color, texture, turgor normal. No rashes or lesions Lymph nodes: cervical, supraclavicular, and axillary nodes normal. Neurologic: grossly normal  Pelvic: External genitalia:  no lesions              No abnormal inguinal nodes palpated.              Urethra:  normal appearing urethra with no masses, tenderness or lesions              Bartholins and Skenes: normal                 Vagina: normal appearing vagina with normal color and discharge, no lesions              Cervix: no lesions.  Third degree cervical prolapse.               Pap taken: no Bimanual Exam:  Uterus:  absent.               Adnexa: no mass, fullness, tenderness              Rectal exam: yes.  Confirms.              Anus:  normal sphincter tone, no lesions  Chaperone was present for exam: AmaEstill BambergMA  Assessment:   Well woman visit with gynecologic exam. Status post supracervical hysterectomy/BSO. Use of compounded HRT and testosterone.  Bi-Est.  Cervical and bladder prolapse, progressive. Status post bowel resection. Remote hx of abnormal paps and cryotherapy.  Plan: Mammogram  screening discussed. Self breast awareness reviewed. Pap and HR HPV as above. Guidelines for Calcium, Vitamin D, regular exercise program including cardiovascular and weight bearing exercise. Referral to Dr. MicSherlene Shamsr surgical evaluation and treatment.  Follow up annually and prn.   After visit summary provided.

## 2021-11-11 ENCOUNTER — Telehealth: Payer: Self-pay | Admitting: Obstetrics and Gynecology

## 2021-11-11 ENCOUNTER — Encounter: Payer: Self-pay | Admitting: Obstetrics and Gynecology

## 2021-11-11 ENCOUNTER — Ambulatory Visit (INDEPENDENT_AMBULATORY_CARE_PROVIDER_SITE_OTHER): Payer: BC Managed Care – PPO | Admitting: Obstetrics and Gynecology

## 2021-11-11 VITALS — BP 130/80 | HR 98 | Ht 61.5 in | Wt 191.0 lb

## 2021-11-11 DIAGNOSIS — N8185 Cervical stump prolapse: Secondary | ICD-10-CM | POA: Diagnosis not present

## 2021-11-11 DIAGNOSIS — Z01419 Encounter for gynecological examination (general) (routine) without abnormal findings: Secondary | ICD-10-CM | POA: Diagnosis not present

## 2021-11-11 NOTE — Patient Instructions (Signed)

## 2021-11-11 NOTE — Telephone Encounter (Signed)
Please make a referral to Dr. Sherlene Shams for cervical stump prolapse.

## 2021-11-12 NOTE — Telephone Encounter (Signed)
Referral placed her office will call to schedule.

## 2021-11-23 NOTE — Telephone Encounter (Signed)
Appt scheduled for 11/29/21 with Dr. Wannetta Sender.

## 2021-11-24 ENCOUNTER — Encounter: Payer: Self-pay | Admitting: Obstetrics and Gynecology

## 2021-11-24 NOTE — Progress Notes (Unsigned)
Cardiology Office Note:    Date:  11/25/2021   ID:  Merrissa Giacobbe, DOB 1960/12/25, MRN [id number]  PCP:  Patient, No Pcp Per   [facility] Prov[facility] Cardiologist:  Janina Mayo, MD     Referring MD: Whitman Nation, MD   No chief complaint on file. Atypical Chest pain  History of Present Illness:    Valerie Vasquez is a 61 y.o. female with a hx of preDM, fibromyalgia, no smoking, no family hx referral for chest pain. EKG showed NSR no ischemic changes. She states: she has chest spasm. The pain is sharp and stabbing. It feels like a tight bra. She takes a muscle relaxer and this helps. She can get some shortness of breath with carrying limbs with walking frequently back and forth. She gained 15 pounds recently. Otherwise, she has no family hx of coronary dx.  Past Medical History:  Diagnosis Date   Chronic fatigue syndrome    Elevated cholesterol    Endometriosis    Fibroid    Fibromyalgia    IBS (irritable bowel syndrome)    Prediabetes    Thyroid disease    Hypothyroidism   Urinary incontinence     Past Surgical History:  Procedure Laterality Date   APPENDECTOMY  2007   at same time of D & C    bowel ressection  2007   along with D & C, hysteroscopy and appendectomy--[place]   BREAST SURGERY  06/2012   breast reduction--[place]   CHOLECYSTECTOMY     ENDOMETRIAL ABLATION  07/29/2005   hysteroscopy, dilation and curettage, Novasure endometrial ablation, laparoscopic lysis of adhesions and drainage of left ovarian cyst - [place]   HYSTEROSCOPIC MYOMECTOMY  08/17/2007   Dilation and curettage - [place] SUPRACERVICAL HYSTERECTOMY  2009   right salpingo-oophorectoy, left oophorectomy - [place]   PELVIC LAPAROSCOPY  2009   removed fibroid, cleared fluid from uterus--[place]   SALPINGECTOMY  2004   laparoscopic left salpinectomy, partial right salpingectomy, lysis of adhesions between omentum and right ubilical libament, dx hystereoscopy with  dilation and curettage   TUBAL LIGATION  1990   [place]    Current Medications:   Allergies:   Codeine, Diazepam, Morphine, and Topiramate   Social History   Socioeconomic History   Marital status: Married    Spouse name: Not on file   Number of children: Not on file   Years of education: Not on file   Highest education level: Not on file  Occupational History   Not on file  Tobacco Use   Smoking status: Never   Smokeless tobacco: Never  Vaping Use   Vaping Use: Never used  Substance and Sexual Activity   Alcohol use: Not Currently   Drug use: Never   Sexual activity: Not Currently    Birth control/protection: Surgical    Comment: Tubal/TVH/first intercourse >, less than 5 partners  Other Topics Concern   Not on file  Social History Narrative   Not on file   Social Determinants of Health   Financial Resource Strain: Not on file  Food Insecurity: Not on file  Transportation Needs: Not on file  Physical Activity: Not on file  Stress: Not on file  Social Connections: Not on file     Family History: The patient's family history includes Atrial fibrillation in her mother; Diabetes in her paternal grandmother; Hypertension in her brother and mother; Stroke in her maternal grandmother.  ROS:   Please see the history  of present illness.     All other systems reviewed and are negative.  EKGs/Labs/Other Studies Reviewed:    The following studies were reviewed today:   EKG:  EKG is  ordered today.  The ekg ordered today demonstrates   11/25/2021-NSR, poor R wave progression  Recent Labs: No results found for requested labs within last 365 days.  Recent Lipid Panel No results found for: "CHOL", "TRIG", "HDL", "CHOLHDL", "VLDL", "LDLCALC", "LDLDIRECT"   Risk Assessment/Calculations:           Physical Exam:    VS:   Vitals:   11/25/21 0849  BP: 132/88  Pulse: 80  SpO2: 96%     Wt Readings from Last 3 Encounters:  11/25/21 203 lb (92.1 kg)  11/11/21  191 lb (86.6 kg)  12/09/20 170 lb (77.1 kg)     GEN:  Well nourished, well developed in no acute distress HEENT: Normal NECK: No JVD; No carotid bruits LYMPHATICS: No lymphadenopathy CARDIAC: RRR, no murmurs, rubs, gallops RESPIRATORY:  Clear to auscultation without rales, wheezing or rhonchi  ABDOMEN: Soft, non-tender, non-distended MUSCULOSKELETAL:  No edema; No deformity  SKIN: Warm and dry NEUROLOGIC:  Alert and oriented x 3 PSYCHIATRIC:  Normal affect   ASSESSMENT:    Atypical Chest Pain: She is relatively low CVD risk. Poor R wave progression on EKG is likely related to habitus. Her symptoms are atypical. We discussed SOB likely related to deconditioning. We discussed if this progresses she can let us know to plan for stress testing. Otherwise, her symptoms are not concerning for coronary dx.  HLD: continue crestor 5 mg  PLAN:    In order of problems listed above:  No further cardiac work up at this time         Medication Adjustments/Labs and Tests Ordered: Current medicines are reviewed at length with the patient today.  Concerns regarding medicines are outlined above.  No orders of the defined types were placed in this encounter.  No orders of the defined types were placed in this encounter.   Patient Instructions  Medication Instructions:  No Changes In Medications at this time.  *If you need a refill on your cardiac medications before your next appointment, please call your pharmacy*  Lab Work: None Ordered At This Time.  If you have labs (blood work) drawn today and your tests are completely normal, you will receive your results only by: [place] (if you have MyChart) OR A paper copy in the mail If you have any lab test that is abnormal or we need to change your treatment, we will call you to review the results.  Testing/Procedures: None Ordered At This Time.   Follow-Up: At [facility], you and your health needs are our priority.  As part  of our continuing mission to provide you with exceptional heart care, we have created designated Provider Care Teams.  These Care Teams include your primary Cardiologist (physician) and Advanced Practice Providers (APPs -  Physician Assistants and Nurse Practitioners) who all work together to provide you with the care you need, when you need it.  Your next appointment:   AS NEEDED   The format for your next appointment:   In Person  Provider:   Janina Mayo, MD           Signed, Janina Mayo, MD  11/25/2021 9:09 AM    [place]

## 2021-11-25 ENCOUNTER — Ambulatory Visit (INDEPENDENT_AMBULATORY_CARE_PROVIDER_SITE_OTHER): Payer: BC Managed Care – PPO | Admitting: Internal Medicine

## 2021-11-25 ENCOUNTER — Encounter: Payer: Self-pay | Admitting: Internal Medicine

## 2021-11-25 VITALS — BP 132/88 | HR 80 | Ht 61.0 in | Wt 203.0 lb

## 2021-11-25 DIAGNOSIS — R079 Chest pain, unspecified: Secondary | ICD-10-CM

## 2021-11-25 NOTE — Patient Instructions (Signed)

## 2021-11-29 ENCOUNTER — Ambulatory Visit: Payer: BC Managed Care – PPO | Admitting: Obstetrics and Gynecology

## 2021-11-29 ENCOUNTER — Encounter: Payer: Self-pay | Admitting: Obstetrics and Gynecology

## 2021-11-29 VITALS — BP 131/86 | HR 88 | Ht 61.0 in | Wt 192.0 lb

## 2021-11-29 DIAGNOSIS — N8110 Cystocele, unspecified: Secondary | ICD-10-CM

## 2021-11-29 DIAGNOSIS — N816 Rectocele: Secondary | ICD-10-CM | POA: Diagnosis not present

## 2021-11-29 DIAGNOSIS — R350 Frequency of micturition: Secondary | ICD-10-CM | POA: Diagnosis not present

## 2021-11-29 DIAGNOSIS — N393 Stress incontinence (female) (male): Secondary | ICD-10-CM

## 2021-11-29 DIAGNOSIS — N8185 Cervical stump prolapse: Secondary | ICD-10-CM | POA: Diagnosis not present

## 2021-11-29 LAB — POCT URINALYSIS DIPSTICK
Bilirubin, UA: NEGATIVE
Blood, UA: NEGATIVE
Glucose, UA: NEGATIVE
Ketones, UA: NEGATIVE
Leukocytes, UA: NEGATIVE
Nitrite, UA: NEGATIVE
Protein, UA: NEGATIVE
Spec Grav, UA: 1.015 (ref 1.010–1.025)
Urobilinogen, UA: 0.2 E.U./dL
pH, UA: 6 (ref 5.0–8.0)

## 2021-11-29 NOTE — Patient Instructions (Signed)
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
## 2021-12-13 ENCOUNTER — Ambulatory Visit (INDEPENDENT_AMBULATORY_CARE_PROVIDER_SITE_OTHER): Payer: BC Managed Care – PPO | Admitting: Obstetrics and Gynecology

## 2021-12-13 ENCOUNTER — Encounter: Payer: Self-pay | Admitting: Obstetrics and Gynecology

## 2021-12-13 VITALS — BP 130/83 | HR 108

## 2021-12-13 DIAGNOSIS — N393 Stress incontinence (female) (male): Secondary | ICD-10-CM | POA: Diagnosis not present

## 2021-12-13 DIAGNOSIS — R350 Frequency of micturition: Secondary | ICD-10-CM | POA: Diagnosis not present

## 2021-12-13 LAB — POCT URINALYSIS DIPSTICK
Bilirubin, UA: NEGATIVE
Blood, UA: NEGATIVE
Glucose, UA: NEGATIVE
Ketones, UA: NEGATIVE
Leukocytes, UA: NEGATIVE
Nitrite, UA: NEGATIVE
Protein, UA: NEGATIVE
Spec Grav, UA: 1.01 (ref 1.010–1.025)
Urobilinogen, UA: 0.2 E.U./dL
pH, UA: 5.5 (ref 5.0–8.0)

## 2021-12-13 NOTE — Patient Instructions (Signed)
Taking Care of Yourself after Urodynamics Drink plenty of water for a day or two following your procedure. Try to have about 8 ounces (one cup) at a time, and do this 6 times or more per day unless you have fluid restrictitons AVOID irritative beverages such as coffee, tea, soda, alcoholic or citrus drinks for a day or two, as this may cause burning with urination.  For the first 1-2 days after the procedure, your urine may be pink or red in color. You may have some blood in your urine as a normal side effect of the procedure. Large amounts of bleeding or difficulty urinating are NOT normal. Call the nurse line if this happens or go to the nearest Emergency Room if the bleeding is heavy or you cannot urinate at all and it is after hours.  You may experience some discomfort or a burning sensation with urination after having this procedure. You can use over the counter Azo or pyridium to help with burning and follow the instructions on the packaging. If it does not improve within 1-2 days, or other symptoms appear (fever, chills, or difficulty urinating) call the office to speak to a nurse.  You may return to normal daily activities such as work, school, driving, exercising and housework on the day of the procedure.    

## 2021-12-13 NOTE — Progress Notes (Signed)
[facility] Urodynamics Procedure  Referring Physician: No ref. provider found Date of Procedure: 12/13/2021  Valerie Vasquez is a 61 y.o. female who presents for urodynamic evaluation. Indication(s) for study: mixed incontinence  Vital Signs: BP 130/83   Pulse (!) 108   Laboratory Results: A catheterized urine specimen revealed:  POC urine: negative  Voiding Diary: Not performed  Procedure Timeout:  The correct patient was verified and the correct procedure was verified. The patient was in the correct position and safety precautions were reviewed based on at the patient's history.  Urodynamic Procedure A 65F dual lumen urodynamics catheter was placed under sterile conditions into the patient's bladder. A 65F catheter was placed into the rectum in order to measure abdominal pressure. EMG patches were placed in the appropriate position.  All connections were confirmed and calibrations/adjusted made. Saline was instilled into the bladder through the dual lumen catheters.  Cough/valsalva pressures were measured periodically during filling.  Patient was allowed to void.  The bladder was then emptied of its residual.  UROFLOW: Revealed a Qmax of 12.5 mL/sec.  She voided 200 mL and had a residual of 20 mL.  It was a interrupted pattern and represented normal habits.  CMG: This was performed with sterile water in the sitting position at a fill rate of 30- 40 mL/min.    First sensation of fullness was 100 mLs,  First urge was 193 mLs,  Strong urge was 445 mLs and  Capacity was 636 mLs  Stress incontinence was demonstrated:  Lowest positive Barrier CLPP was 147 cmH20 at 560 ml. Highest negative Barrier VLPP was 125 cmH20 at 560 ml.  Detrusor function was overactive, with phasic contractions seen.  The first occurred at 436 mL to 3.6 cm of water and was not associated with leakage.  Compliance:  normal. End fill detrusor pressure was 3.2cmH20.  Calculated compliance was  92m/cmH20  UPP: MUCP without barrier reduction was 84 cm of water.    MICTURITION STUDY: Voiding was performed with reduction using scopettes in the sitting position.  Pdet at Qmax was 4 cm of water.  Qmax was 19 mL/sec.  It was a normal pattern.  She voided 631 mL and had a residual of 5 mL.  It was a volitional void, sustained detrusor contraction was present and abdominal straining was not present  EMG: This was performed with patches.  She had voluntary contractions, recruitment with fill was present and urethral sphincter was relaxed with void.  The details of the procedure with the study tracings have been scanned into EPIC.   Urodynamic Impression:  1. Sensation was normal; capacity was increased 2. Stress Incontinence was demonstrated at normal pressures; 3. Detrusor Overactivity was demonstrated without leakage. 4. Emptying was normal with a normal PVR, a sustained detrusor contraction present,  abdominal straining not present, normal urethral sphincter activity on EMG.  Plan: - The patient will follow up  to discuss the findings and treatment options.

## 2021-12-24 ENCOUNTER — Encounter: Payer: Self-pay | Admitting: Obstetrics and Gynecology

## 2021-12-24 ENCOUNTER — Ambulatory Visit (INDEPENDENT_AMBULATORY_CARE_PROVIDER_SITE_OTHER): Payer: BC Managed Care – PPO | Admitting: Obstetrics and Gynecology

## 2021-12-24 VITALS — BP 144/85 | HR 96

## 2021-12-24 DIAGNOSIS — N3281 Overactive bladder: Secondary | ICD-10-CM | POA: Diagnosis not present

## 2021-12-24 DIAGNOSIS — N8110 Cystocele, unspecified: Secondary | ICD-10-CM

## 2021-12-24 DIAGNOSIS — N393 Stress incontinence (female) (male): Secondary | ICD-10-CM | POA: Diagnosis not present

## 2021-12-24 DIAGNOSIS — N8185 Cervical stump prolapse: Secondary | ICD-10-CM | POA: Diagnosis not present

## 2021-12-24 NOTE — Progress Notes (Signed)
[facility] Return Visit  SUBJECTIVE  History of Present Illness: Valerie Vasquez is a 62 y.o. female with Stage II POP presenting to discuss surgery. She had underwent urodynamic testing.   Urodynamic Impression:  1. Sensation was normal; capacity was increased 2. Stress Incontinence was demonstrated at normal pressures; 3. Detrusor Overactivity was demonstrated without leakage. 4. Emptying was normal with a normal PVR, a sustained detrusor contraction present,  abdominal straining not present, normal urethral sphincter activity on EMG.  Past Medical History: Patient  has a past medical history of Chronic fatigue syndrome, Elevated cholesterol, Endometriosis, Fibroid, Fibromyalgia, IBS (irritable bowel syndrome), Prediabetes, Thyroid disease, and Urinary incontinence.   Past Surgical History: She  has a past surgical history that includes Cholecystectomy; Tubal ligation (1990); Appendectomy (2007); bowel ressection (2007); Endometrial ablation (07/29/2005); Pelvic laparoscopy (2009); Breast surgery (06/2012); Laparoscopic supracervical hysterectomy (2009); Salpingectomy (2004); and HYSTEROSCOPIC MYOMECTOMY (08/17/2007).   Medications: She has a current medication list which includes the following prescription(s): armour thyroid, metaxalone, NONFORMULARY OR COMPOUNDED ITEM, NONFORMULARY OR COMPOUNDED ITEM, NONFORMULARY OR COMPOUNDED ITEM, and OVER THE COUNTER MEDICATION.   Allergies: Patient is allergic to codeine, diazepam, morphine, and topiramate.   Social History: Patient  reports that she has never smoked. She has never used smokeless tobacco. She reports that she does not currently use alcohol. She reports that she does not use drugs.      OBJECTIVE     Physical Exam: Vitals:   12/24/21 1259  BP: (!) 144/85  Pulse: 96   Gen: No apparent distress, A&O x 3.  Detailed Urogynecologic Evaluation:  Deferred. Prior exam showed:  POP-Q:    POP-Q   0                                             Aa   0                                           Ba   1                                              C    3.5                                            Gh   3                                            Pb   5.5                                            tvl    -1  Ap   -1                                            Bp   -4                                              D        ASSESSMENT AND PLAN    Valerie Vasquez is a 61 y.o. with:  1. Cervical stump prolapse   2. Prolapse of anterior vaginal wall   3. SUI (stress urinary incontinence, female)   4. Overactive bladder      Plan for surgery: Exam under anesthesia, trachelectomy, robotic assisted sacrocolpopexy, midurethral sling, cystoscopy  - We reviewed the patient's specific anatomic and functional findings, with the assistance of diagrams, and together finalized the above procedure.   - For preop Visit:  She is required to have a visit within 30 days of her surgery.    - Medical clearance: not required  - Anticoagulant use: No - Medicaid Hysterectomy form: n/a - Accepts blood transfusion: will confirm at pre op - Expected length of stay: outpatient  Request sent for surgery scheduling.   Jaquita Folds, MD

## 2021-12-24 NOTE — Patient Instructions (Signed)
Plan for surgery: Trachelectomy, Robotic assisted sacrocolpopexy, midurethral sling, cystoscopy

## 2022-01-20 ENCOUNTER — Encounter: Payer: BC Managed Care – PPO | Admitting: Obstetrics and Gynecology

## 2022-01-21 IMAGING — DX DG CHEST 2V
2 series · 2 of 2 positions shown · non-contrast
Comparison: None.

CLINICAL DATA: 58-year-old female with persistent chest pain. Neck
pain, headache, shortness of breath and fatigue.

EXAM:
CHEST - 2 VIEW

[chest pa]
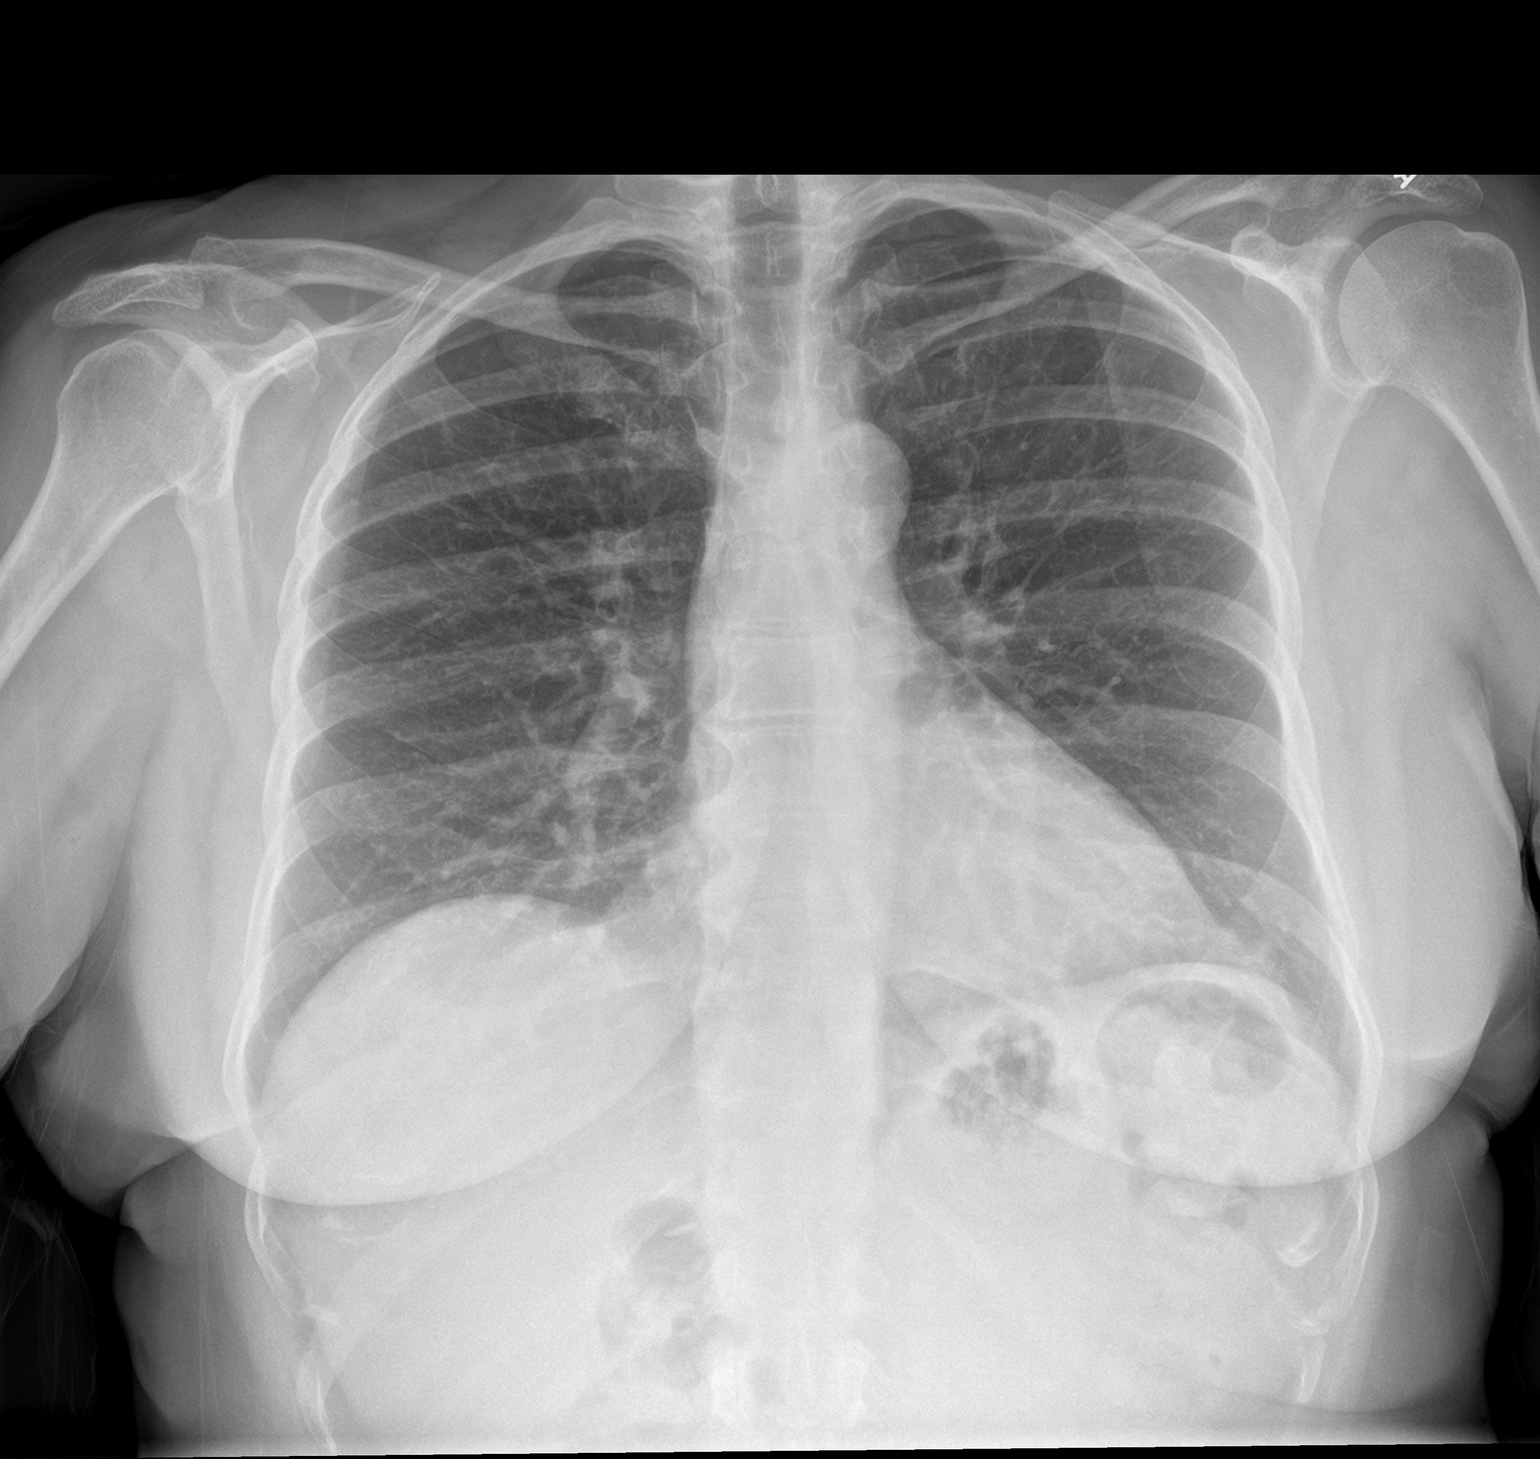

[chest lat]
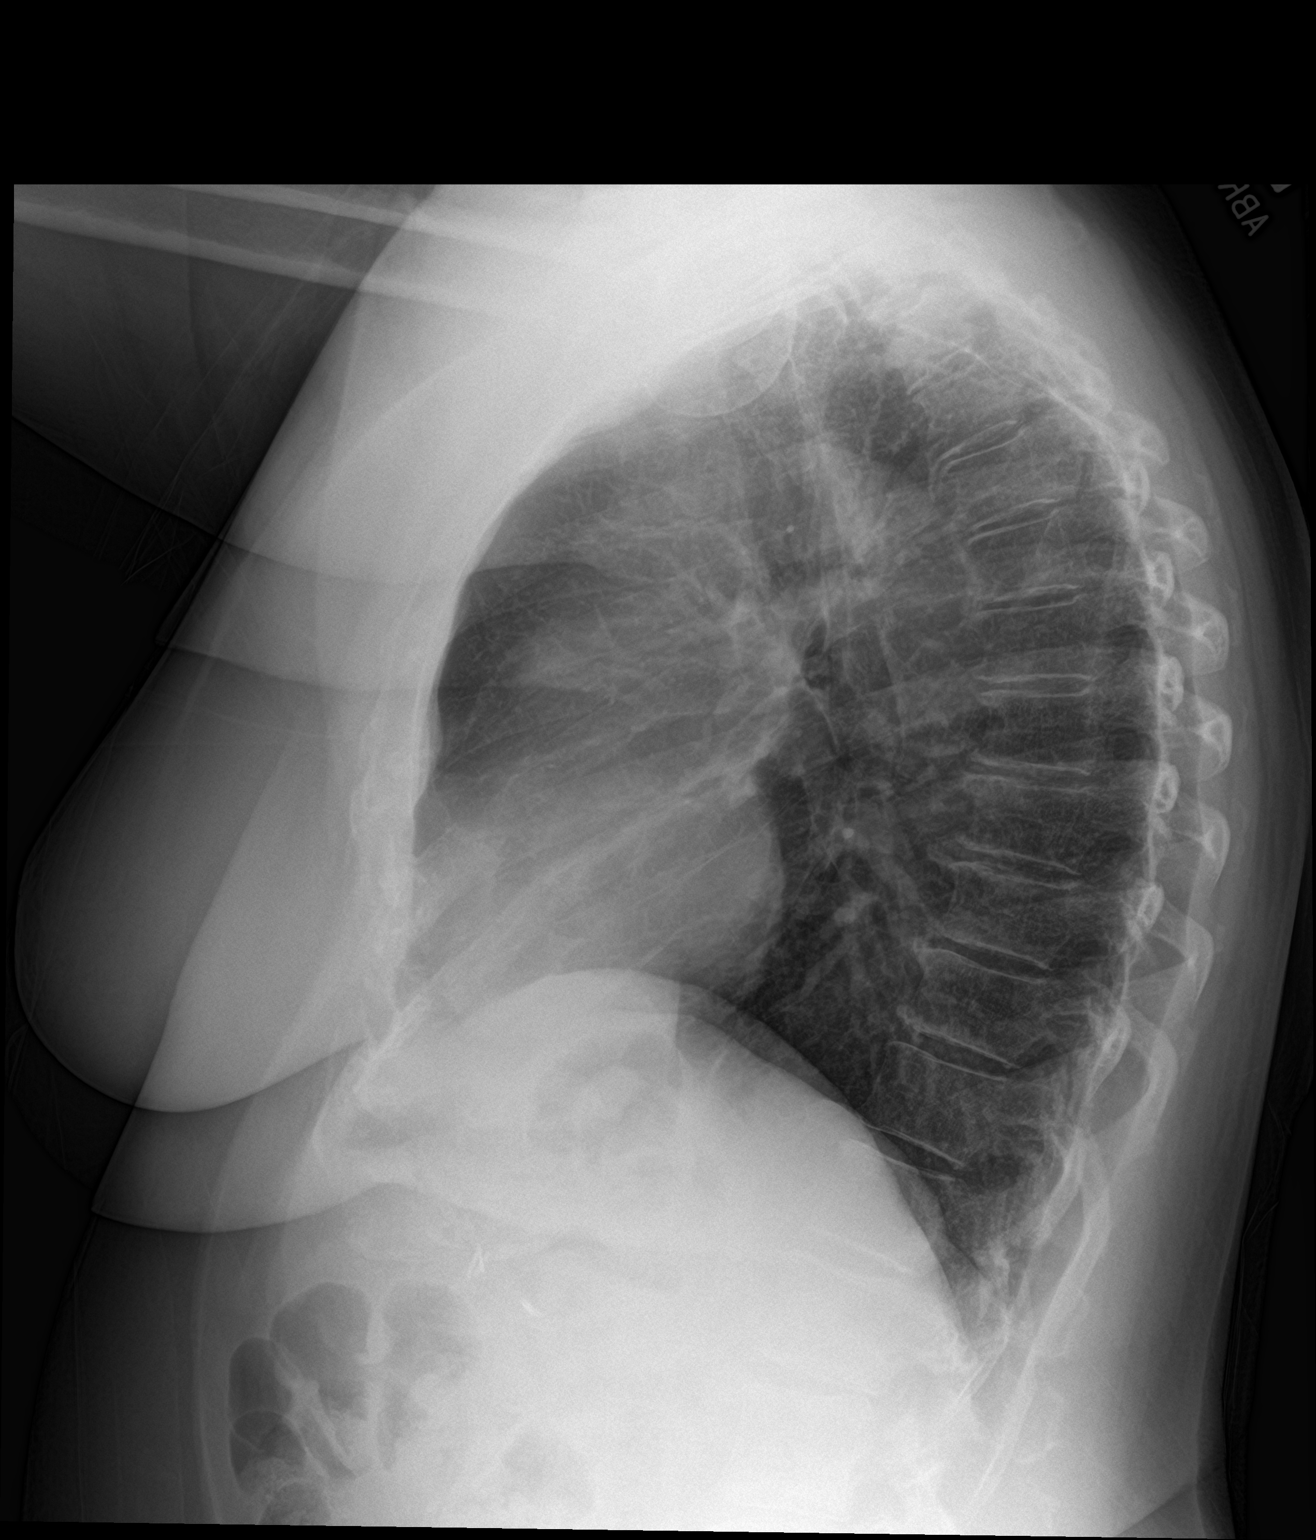

[2 of 2 positions shown; findings below may reference images not displayed]

FINDINGS: Normal lung volumes and mediastinal contours. Visualized tracheal
air column is within normal limits. Both lungs appear clear. No
pneumothorax or pleural effusion.

No acute osseous abnormality identified. Cholecystectomy clips in
the right upper quadrant. Negative visible bowel gas pattern.
IMPRESSION: Negative.  No acute cardiopulmonary abnormality.

## 2022-01-28 ENCOUNTER — Ambulatory Visit: Payer: BC Managed Care – PPO | Admitting: Obstetrics and Gynecology

## 2022-02-21 ENCOUNTER — Encounter: Payer: Self-pay | Admitting: *Deleted

## 2022-02-22 ENCOUNTER — Ambulatory Visit (INDEPENDENT_AMBULATORY_CARE_PROVIDER_SITE_OTHER): Payer: BC Managed Care – PPO | Admitting: Obstetrics and Gynecology

## 2022-02-22 ENCOUNTER — Encounter: Payer: Self-pay | Admitting: Obstetrics and Gynecology

## 2022-02-22 VITALS — BP 139/84 | HR 73

## 2022-02-22 DIAGNOSIS — N8110 Cystocele, unspecified: Secondary | ICD-10-CM

## 2022-02-22 DIAGNOSIS — N393 Stress incontinence (female) (male): Secondary | ICD-10-CM

## 2022-02-22 DIAGNOSIS — N8185 Cervical stump prolapse: Secondary | ICD-10-CM | POA: Diagnosis not present

## 2022-02-22 MED ORDER — IBUPROFEN 600 MG PO TABS: 600.0000 mg | ORAL_TABLET | Freq: Four times a day (QID) | ORAL | 0 refills | Status: DC | PRN

## 2022-02-22 MED ORDER — OXYCODONE HCL 5 MG PO TABS: 5.0000 mg | ORAL_TABLET | ORAL | 0 refills | Status: DC | PRN

## 2022-02-22 MED ORDER — ACETAMINOPHEN 500 MG PO TABS: 500.0000 mg | ORAL_TABLET | Freq: Four times a day (QID) | ORAL | 0 refills | Status: DC | PRN

## 2022-02-22 MED ORDER — POLYETHYLENE GLYCOL 3350 17 GM/SCOOP PO POWD: 17.0000 g | Freq: Every day | ORAL | 0 refills | Status: DC

## 2022-02-22 NOTE — Progress Notes (Signed)
Urogynecology Pre-Operative visit  Subjective Chief Complaint: Valerie Vasquez presents for a preoperative encounter.   History of Present Illness: Valerie Vasquez is a 61 y.o. female who presents for preoperative visit.  She is scheduled to undergo Exam under anesthesia, trachelectomy, robotic assisted sacrocolpopexy, midurethral sling, cystoscopy on 03/16/22.  Her symptoms include vaginal bulge and incontinence, and she was was found to have Stage II anterior, Stage II posterior, Stage II apical prolapse.   Urodynamic Impression:  1. Sensation was normal; capacity was increased 2. Stress Incontinence was demonstrated at normal pressures; 3. Detrusor Overactivity was demonstrated without leakage. 4. Emptying was normal with a normal PVR, a sustained detrusor contraction present,  abdominal straining not present, normal urethral sphincter activity on EMG.  Past Medical History:  Diagnosis Date   Chronic fatigue syndrome    Elevated cholesterol    Endometriosis    Fibroid    Fibromyalgia    IBS (irritable bowel syndrome)    Prediabetes    Thyroid disease    Hypothyroidism   Urinary incontinence      Past Surgical History:  Procedure Laterality Date   APPENDECTOMY  2007   at same time of D & C    bowel ressection  2007   along with D & C, hysteroscopy and appendectomy--[place]   BREAST SURGERY  06/2012   breast reduction--[place]   CHOLECYSTECTOMY     ENDOMETRIAL ABLATION  07/29/2005   hysteroscopy, dilation and curettage, Novasure endometrial ablation, laparoscopic lysis of adhesions and drainage of left ovarian cyst - [place]   HYSTEROSCOPIC MYOMECTOMY  08/17/2007   Dilation and curettage - [place] SUPRACERVICAL HYSTERECTOMY  2009   right salpingo-oophorectoy, left oophorectomy - [place]   PELVIC LAPAROSCOPY  2009   removed fibroid, cleared fluid from uterus--[place]   SALPINGECTOMY  2004   laparoscopic left salpinectomy, partial right  salpingectomy, lysis of adhesions between omentum and right ubilical libament, dx hystereoscopy with dilation and curettage   [place]    is allergic to codeine, diazepam, morphine, and topiramate.   Family History  Problem Relation Age of Onset   Hypertension Mother    Atrial fibrillation Mother    Parkinson's disease Father    Hypertension Brother    Stroke Maternal Grandmother    Diabetes Paternal Grandmother     Social History   Tobacco Use   Smoking status: Never   Smokeless tobacco: Never  Vaping Use   Vaping Use: Never used  Substance Use Topics   Alcohol use: Not Currently   Drug use: Never     Review of Systems was negative for a full 10 system review except as noted in the History of Present Illness.   Current Outpatient Medications:    ARMOUR THYROID 30 MG tablet, Take 30 mg by mouth daily., Disp: , Rfl:    metaxalone (SKELAXIN) 800 MG tablet, Take 1 tablet by mouth as needed., Disp: , Rfl:    NONFORMULARY OR COMPOUNDED ITEM, Bi-Est 80/20 cream '1mg'$ /78m combined with Progesterone 1485m'30mg'c$   Apply 6 nights per week, Disp: , Rfl:    NONFORMULARY OR COMPOUNDED ITEM, Liothyronine SR 8m61mTakes daily, Disp: , Rfl:    NONFORMULARY OR COMPOUNDED ITEM, Testosterone cream, Disp: , Rfl:    OVER THE COUNTER MEDICATION, Multiple supplements, Disp: , Rfl:    Objective Vitals:   02/22/22 0919  BP: 139/84  Pulse: 73    Gen: NAD CV: S1 S2 RRR Lungs:  Clear to auscultation bilaterally Abd: soft, nontender   Previous Pelvic Exam showed: POP-Q:    POP-Q   0                                            Aa   0                                           Ba   1                                              C    3.5                                            Gh   3                                            Pb   5.5                                            tvl    -1                                            Ap   -1                                             Bp   -4                                              D      Assessment/ Plan  Assessment: The patient is a 61 y.o. year old scheduled to undergo Exam under anesthesia, trachelectomy, robotic assisted sacrocolpopexy, midurethral sling, cystoscopy. Verbal consent was obtained for these procedures.  Plan: General Surgical Consent: The patient has previously been counseled on alternative treatments, and the decision by the patient and provider was to proceed with the procedure listed above.  For all procedures, there are risks of bleeding, infection, damage to surrounding organs including but not limited to bowel, bladder, blood vessels, ureters and nerves, and need for further surgery if an injury were to occur. These risks are all low with minimally invasive surgery.   There are risks of numbness and weakness at any body site or buttock/rectal pain.  It is possible that baseline pain can be worsened by surgery, either with or without mesh. If surgery is vaginal, there is also  a low risk of possible conversion to laparoscopy or open abdominal incision where indicated. Very rare risks include blood transfusion, blood clot, heart attack, pneumonia, or death.   There is also a risk of short-term postoperative urinary retention with need to use a catheter. About half of patients need to go home from surgery with a catheter, which is then later removed in the office. The risk of long-term need for a catheter is very low. There is also a risk of worsening of overactive bladder.   Sling: The effectiveness of a midurethral vaginal mesh sling is approximately 85%, and thus, there will be times when you may leak urine after surgery, especially if your bladder is full or if you have a strong cough. There is a balance between making the sling tight enough to treat your leakage but not too tight so that you have long-term difficulty emptying your bladder. A mesh sling will not directly treat  overactive bladder/urge incontinence and may worsen it.  There is an FDA safety notification on vaginal mesh procedures for prolapse but NOT mesh slings. We have extensive experience and training with mesh placement and we have close postoperative follow up to identify any potential complications from mesh. It is important to realize that this mesh is a permanent implant that cannot be easily removed. There are rare risks of mesh exposure (2-4%), pain with intercourse (0-7%), and infection (<1%). The risk of mesh exposure if more likely in a woman with risks for poor healing (prior radiation, poorly controlled diabetes, or immunocompromised). The risk of new or worsened chronic pain after mesh implant is more common in women with baseline chronic pain and/or poorly controlled anxiety or depression. Approximately 2-4% of patients will experience longer-term post-operative voiding dysfunction that may require surgical revision of the sling. We also reviewed that postoperatively, her stream may not be as strong as before surgery.   Prolapse (with or without mesh): Risk factors for surgical failure  include things that put pressure on your pelvis and the surgical repair, including obesity, chronic cough, and heavy lifting or straining (including lifting children or adults, straining on the toilet, or lifting heavy objects such as furniture or anything weighing >25 lbs. Risks of recurrence is 20-30% with vaginal native tissue repair and a less than 10% with sacrocolpopexy with mesh.    Sacrocolpopexy: Mesh implants may provide more prolapse support, but do have some unique risks to consider. It is important to understand that mesh is permanent and cannot be easily removed. Risks of abdominal sacrocolpopexy mesh include mesh exposure (~3-6%), painful intercourse (recent studies show lower rates after surgery compared to before, with ~5-8% risk of new onset), and very rare risks of bowel or bladder injury or  infection (<1%). The risk of mesh exposure is more likely in a woman with risks for poor healing (prior radiation, poorly controlled diabetes, or immunocompromised). The risk of new or worsened chronic pain after mesh implant is more common in women with baseline chronic pain and/or poorly controlled anxiety or depression. There is an FDA safety notification on vaginal mesh procedures for prolapse but NOT abdominal mesh procedures and therefore does not apply to your surgery. We have extensive experience and training with mesh placement and we have close postoperative follow up to identify any potential complications from mesh.    We discussed consent for blood products. Risks for blood transfusion include allergic reactions, other reactions that can affect different body organs and managed accordingly, transmission of infectious diseases such as  HIV or Hepatitis. However, the blood is screened. Patient consents for blood products.  Pre-operative instructions:  She was instructed to not take Aspirin/NSAIDs x 7days prior to surgery.  Antibiotic prophylaxis was ordered as indicated.  Catheter use: Patient will go home with foley if needed after post-operative voiding trial.  Post-operative instructions:  She was provided with specific post-operative instructions, including precautions and signs/symptoms for which we would recommend contacting us, in addition to daytime and after-hours contact phone numbers. This was provided on a handout.   Post-operative medications: Prescriptions for motrin, tylenol, miralax, and oxycodone were sent to her pharmacy. Discussed using ibuprofen and tylenol on a schedule to limit use of narcotics.   Laboratory testing:  We will check labs: CBC, type and screen   Preoperative clearance:  She does not require surgical clearance.    Post-operative follow-up:  A post-operative appointment will be made for 6 weeks from the date of surgery. If she needs a post-operative nurse  visit for a voiding trial, that will be set up after she leaves the hospital.    Patient will call the clinic or use MyChart should anything change or any new issues arise.   Jaquita Folds, MD   Time spent: I spent 25 minutes dedicated to the care of this patient on the date of this encounter to include pre-visit review of records, face-to-face time with the patient and post visit documentation and ordering medication/ testing.

## 2022-02-22 NOTE — H&P (View-Only) (Signed)
[facility] Pre-Operative visit  Subjective Chief Complaint: Valerie Vasquez presents for a preoperative encounter.   History of Present Illness: Valerie Vasquez is a 61 y.o. female who presents for preoperative visit.  She is scheduled to undergo Exam under anesthesia, trachelectomy, robotic assisted sacrocolpopexy, midurethral sling, cystoscopy on 03/16/22.  Her symptoms include vaginal bulge and incontinence, and she was was found to have Stage II anterior, Stage II posterior, Stage II apical prolapse.   Urodynamic Impression:  1. Sensation was normal; capacity was increased 2. Stress Incontinence was demonstrated at normal pressures; 3. Detrusor Overactivity was demonstrated without leakage. 4. Emptying was normal with a normal PVR, a sustained detrusor contraction present,  abdominal straining not present, normal urethral sphincter activity on EMG.  Past Medical History:  Diagnosis Date   Chronic fatigue syndrome    Elevated cholesterol    Endometriosis    Fibroid    Fibromyalgia    IBS (irritable bowel syndrome)    Prediabetes    Thyroid disease    Hypothyroidism   Urinary incontinence      Past Surgical History:  Procedure Laterality Date   APPENDECTOMY  2007   at same time of D & C    bowel ressection  2007   along with D & C, hysteroscopy and appendectomy--[place]   BREAST SURGERY  06/2012   breast reduction--[place]   CHOLECYSTECTOMY     ENDOMETRIAL ABLATION  07/29/2005   hysteroscopy, dilation and curettage, Novasure endometrial ablation, laparoscopic lysis of adhesions and drainage of left ovarian cyst - [place]   HYSTEROSCOPIC MYOMECTOMY  08/17/2007   Dilation and curettage - [place] SUPRACERVICAL HYSTERECTOMY  2009   right salpingo-oophorectoy, left oophorectomy - [place]   PELVIC LAPAROSCOPY  2009   removed fibroid, cleared fluid from uterus--[place]   SALPINGECTOMY  2004   laparoscopic left salpinectomy, partial right  salpingectomy, lysis of adhesions between omentum and right ubilical libament, dx hystereoscopy with dilation and curettage   [place]    is allergic to codeine, diazepam, morphine, and topiramate.   Family History  Problem Relation Age of Onset   Hypertension Mother    Atrial fibrillation Mother    Parkinson's disease Father    Hypertension Brother    Stroke Maternal Grandmother    Diabetes Paternal Grandmother     Social History   Tobacco Use   Smoking status: Never   Smokeless tobacco: Never  Vaping Use   Vaping Use: Never used  Substance Use Topics   Alcohol use: Not Currently   Drug use: Never     Review of Systems was negative for a full 10 system review except as noted in the History of Present Illness.   Current Outpatient Medications:    ARMOUR THYROID 30 MG tablet, Take 30 mg by mouth daily., Disp: , Rfl:    metaxalone (SKELAXIN) 800 MG tablet, Take 1 tablet by mouth as needed., Disp: , Rfl:    NONFORMULARY OR COMPOUNDED ITEM, Bi-Est 80/20 cream '1mg'$ /62m combined with Progesterone 1435m'30mg'i$   Apply 6 nights per week, Disp: , Rfl:    NONFORMULARY OR COMPOUNDED ITEM, Liothyronine SR 15m46mTakes daily, Disp: , Rfl:    NONFORMULARY OR COMPOUNDED ITEM, Testosterone cream, Disp: , Rfl:    OVER THE COUNTER MEDICATION, Multiple supplements, Disp: , Rfl:    Objective Vitals:   02/22/22 0919  BP: 139/84  Pulse: 73    Gen: NAD CV: S1 S2 RRR Lungs:  Clear to auscultation bilaterally Abd: soft, nontender   Previous Pelvic Exam showed: POP-Q:    POP-Q   0                                            Aa   0                                           Ba   1                                              C    3.5                                            Gh   3                                            Pb   5.5                                            tvl    -1                                            Ap   -1                                             Bp   -4                                              D      Assessment/ Plan  Assessment: The patient is a 61 y.o. year old scheduled to undergo Exam under anesthesia, trachelectomy, robotic assisted sacrocolpopexy, midurethral sling, cystoscopy. Verbal consent was obtained for these procedures.  Plan: General Surgical Consent: The patient has previously been counseled on alternative treatments, and the decision by the patient and provider was to proceed with the procedure listed above.  For all procedures, there are risks of bleeding, infection, damage to surrounding organs including but not limited to bowel, bladder, blood vessels, ureters and nerves, and need for further surgery if an injury were to occur. These risks are all low with minimally invasive surgery.   There are risks of numbness and weakness at any body site or buttock/rectal pain.  It is possible that baseline pain can be worsened by surgery, either with or without mesh. If surgery is vaginal, there is also  a low risk of possible conversion to laparoscopy or open abdominal incision where indicated. Very rare risks include blood transfusion, blood clot, heart attack, pneumonia, or death.   There is also a risk of short-term postoperative urinary retention with need to use a catheter. About half of patients need to go home from surgery with a catheter, which is then later removed in the office. The risk of long-term need for a catheter is very low. There is also a risk of worsening of overactive bladder.   Sling: The effectiveness of a midurethral vaginal mesh sling is approximately 85%, and thus, there will be times when you may leak urine after surgery, especially if your bladder is full or if you have a strong cough. There is a balance between making the sling tight enough to treat your leakage but not too tight so that you have long-term difficulty emptying your bladder. A mesh sling will not directly treat  overactive bladder/urge incontinence and may worsen it.  There is an FDA safety notification on vaginal mesh procedures for prolapse but NOT mesh slings. We have extensive experience and training with mesh placement and we have close postoperative follow up to identify any potential complications from mesh. It is important to realize that this mesh is a permanent implant that cannot be easily removed. There are rare risks of mesh exposure (2-4%), pain with intercourse (0-7%), and infection (<1%). The risk of mesh exposure if more likely in a woman with risks for poor healing (prior radiation, poorly controlled diabetes, or immunocompromised). The risk of new or worsened chronic pain after mesh implant is more common in women with baseline chronic pain and/or poorly controlled anxiety or depression. Approximately 2-4% of patients will experience longer-term post-operative voiding dysfunction that may require surgical revision of the sling. We also reviewed that postoperatively, her stream may not be as strong as before surgery.   Prolapse (with or without mesh): Risk factors for surgical failure  include things that put pressure on your pelvis and the surgical repair, including obesity, chronic cough, and heavy lifting or straining (including lifting children or adults, straining on the toilet, or lifting heavy objects such as furniture or anything weighing >25 lbs. Risks of recurrence is 20-30% with vaginal native tissue repair and a less than 10% with sacrocolpopexy with mesh.    Sacrocolpopexy: Mesh implants may provide more prolapse support, but do have some unique risks to consider. It is important to understand that mesh is permanent and cannot be easily removed. Risks of abdominal sacrocolpopexy mesh include mesh exposure (~3-6%), painful intercourse (recent studies show lower rates after surgery compared to before, with ~5-8% risk of new onset), and very rare risks of bowel or bladder injury or  infection (<1%). The risk of mesh exposure is more likely in a woman with risks for poor healing (prior radiation, poorly controlled diabetes, or immunocompromised). The risk of new or worsened chronic pain after mesh implant is more common in women with baseline chronic pain and/or poorly controlled anxiety or depression. There is an FDA safety notification on vaginal mesh procedures for prolapse but NOT abdominal mesh procedures and therefore does not apply to your surgery. We have extensive experience and training with mesh placement and we have close postoperative follow up to identify any potential complications from mesh.    We discussed consent for blood products. Risks for blood transfusion include allergic reactions, other reactions that can affect different body organs and managed accordingly, transmission of infectious diseases such as  HIV or Hepatitis. However, the blood is screened. Patient consents for blood products.  Pre-operative instructions:  She was instructed to not take Aspirin/NSAIDs x 7days prior to surgery.  Antibiotic prophylaxis was ordered as indicated.  Catheter use: Patient will go home with foley if needed after post-operative voiding trial.  Post-operative instructions:  She was provided with specific post-operative instructions, including precautions and signs/symptoms for which we would recommend contacting us, in addition to daytime and after-hours contact phone numbers. This was provided on a handout.   Post-operative medications: Prescriptions for motrin, tylenol, miralax, and oxycodone were sent to her pharmacy. Discussed using ibuprofen and tylenol on a schedule to limit use of narcotics.   Laboratory testing:  We will check labs: CBC, type and screen   Preoperative clearance:  She does not require surgical clearance.    Post-operative follow-up:  A post-operative appointment will be made for 6 weeks from the date of surgery. If she needs a post-operative nurse  visit for a voiding trial, that will be set up after she leaves the hospital.    Patient will call the clinic or use MyChart should anything change or any new issues arise.   Jaquita Folds, MD   Time spent: I spent 25 minutes dedicated to the care of this patient on the date of this encounter to include pre-visit review of records, face-to-face time with the patient and post visit documentation and ordering medication/ testing.

## 2022-02-23 NOTE — H&P (Signed)
[facility] Pre-Operative H&P  Subjective Chief Complaint: Valerie Vasquez presents for a preoperative encounter.   History of Present Illness: Valerie Vasquez is a 61 y.o. female who presents for preoperative visit.  She is scheduled to undergo Exam under anesthesia, trachelectomy, robotic assisted sacrocolpopexy, midurethral sling, cystoscopy on 03/16/22.  Her symptoms include vaginal bulge and incontinence, and she was was found to have Stage II anterior, Stage II posterior, Stage II apical prolapse.   Urodynamic Impression:  1. Sensation was normal; capacity was increased 2. Stress Incontinence was demonstrated at normal pressures; 3. Detrusor Overactivity was demonstrated without leakage. 4. Emptying was normal with a normal PVR, a sustained detrusor contraction present,  abdominal straining not present, normal urethral sphincter activity on EMG.  Past Medical History:  Diagnosis Date   Chronic fatigue syndrome    Elevated cholesterol    Endometriosis    Fibroid    Fibromyalgia    IBS (irritable bowel syndrome)    Prediabetes    Thyroid disease    Hypothyroidism   Urinary incontinence      Past Surgical History:  Procedure Laterality Date   APPENDECTOMY  2007   at same time of D & C    bowel ressection  2007   along with D & C, hysteroscopy and appendectomy--[place]   BREAST SURGERY  06/2012   breast reduction--[place]   CHOLECYSTECTOMY     ENDOMETRIAL ABLATION  07/29/2005   hysteroscopy, dilation and curettage, Novasure endometrial ablation, laparoscopic lysis of adhesions and drainage of left ovarian cyst - [place]   HYSTEROSCOPIC MYOMECTOMY  08/17/2007   Dilation and curettage - [place] SUPRACERVICAL HYSTERECTOMY  2009   right salpingo-oophorectoy, left oophorectomy - [place]   PELVIC LAPAROSCOPY  2009   removed fibroid, cleared fluid from uterus--[place]   SALPINGECTOMY  2004   laparoscopic left salpinectomy, partial right salpingectomy,  lysis of adhesions between omentum and right ubilical libament, dx hystereoscopy with dilation and curettage   [place]    is allergic to codeine, diazepam, morphine, and topiramate.   Family History  Problem Relation Age of Onset   Hypertension Mother    Atrial fibrillation Mother    Parkinson's disease Father    Hypertension Brother    Stroke Maternal Grandmother    Diabetes Paternal Grandmother     Social History   Tobacco Use   Smoking status: Never   Smokeless tobacco: Never  Vaping Use   Vaping Use: Never used  Substance Use Topics   Alcohol use: Not Currently   Drug use: Never     Review of Systems was negative for a full 10 system review except as noted in the History of Present Illness.  No current facility-administered medications for this encounter.  Current Outpatient Medications:    acetaminophen (TYLENOL) 500 MG tablet, Take 1 tablet (500 mg total) by mouth every 6 (six) hours as needed (pain)., Disp: 30 tablet, Rfl: 0   ARMOUR THYROID 30 MG tablet, Take 30 mg by mouth daily., Disp: , Rfl:    ibuprofen (ADVIL) 600 MG tablet, Take 1 tablet (600 mg total) by mouth every 6 (six) hours as needed., Disp: 30 tablet, Rfl: 0   metaxalone (SKELAXIN) 800 MG tablet, Take 1 tablet by mouth as needed., Disp: , Rfl:    NONFORMULARY OR COMPOUNDED ITEM, Bi-Est 80/20 cream '1mg'$ /28m combined with Progesterone 1444m'30mg'U$   Apply 6 nights per week, Disp: , Rfl:    NONFORMULARY  OR COMPOUNDED ITEM, Liothyronine SR 58mg Takes daily, Disp: , Rfl:    NONFORMULARY OR COMPOUNDED ITEM, Testosterone cream, Disp: , Rfl:    OVER THE COUNTER MEDICATION, Multiple supplements, Disp: , Rfl:    oxyCODONE (OXY IR/ROXICODONE) 5 MG immediate release tablet, Take 1 tablet (5 mg total) by mouth every 4 (four) hours as needed for severe pain., Disp: 10 tablet, Rfl: 0   polyethylene glycol powder (GLYCOLAX/MIRALAX) 17 GM/SCOOP powder, Take 17 g by mouth daily. Drink 17g (1 scoop)  dissolved in water per day., Disp: 255 g, Rfl: 0   Objective There were no vitals filed for this visit.   Gen: NAD CV: S1 S2 RRR Lungs: Clear to auscultation bilaterally Abd: soft, nontender   Previous Pelvic Exam showed: POP-Q:    POP-Q   0                                            Aa   0                                           Ba   1                                              C    3.5                                            Gh   3                                            Pb   5.5                                            tvl    -1                                            Ap   -1                                            Bp   -4                                              D      Assessment/ Plan  The patient is a 61y.o. year old scheduled to undergo Exam under anesthesia, trachelectomy, robotic assisted sacrocolpopexy, midurethral sling, cystoscopy.   MJaquita Folds MD

## 2022-03-11 ENCOUNTER — Encounter (HOSPITAL_BASED_OUTPATIENT_CLINIC_OR_DEPARTMENT_OTHER): Payer: Self-pay | Admitting: Obstetrics and Gynecology

## 2022-03-14 ENCOUNTER — Encounter (HOSPITAL_BASED_OUTPATIENT_CLINIC_OR_DEPARTMENT_OTHER): Payer: Self-pay | Admitting: Obstetrics and Gynecology

## 2022-03-14 NOTE — Progress Notes (Signed)
Spoke w/ via phone for pre-op interview--- pt Lab needs dos----  cbc, t&s             Lab results------ none COVID test -----patient states asymptomatic no test needed Arrive at ------- 1030 on 03-16-2022 NPO after MN NO Solid Food.  Clear liquids from MN until--- 0930 Med rec completed Medications to take morning of surgery ----- armour thyroid, cymbalta Diabetic medication ----- n/a Patient instructed no nail polish to be worn day of surgery Patient instructed to bring photo id and insurance card day of surgery Patient aware to have Driver (ride ) / caregiver for 24 hours after surgery --- husband, Valerie Vasquez Patient Special Instructions ----- reviewed [facility] and visitor guidelines Pre-Op special Istructions ----- n/a Patient verbalized understanding of instructions that were given at this phone interview. Patient denies shortness of breath, chest pain, fever, cough at this phone interview.

## 2022-03-16 ENCOUNTER — Other Ambulatory Visit: Payer: Self-pay

## 2022-03-16 ENCOUNTER — Ambulatory Visit (HOSPITAL_BASED_OUTPATIENT_CLINIC_OR_DEPARTMENT_OTHER)
Admission: RE | Admit: 2022-03-16 | Discharge: 2022-03-17 | Disposition: A | Discharge status: Home / Self Care | Payer: BC Managed Care – PPO | Attending: Obstetrics and Gynecology | Admitting: Obstetrics and Gynecology

## 2022-03-16 ENCOUNTER — Ambulatory Visit (HOSPITAL_BASED_OUTPATIENT_CLINIC_OR_DEPARTMENT_OTHER): Payer: BC Managed Care – PPO | Admitting: Anesthesiology

## 2022-03-16 ENCOUNTER — Encounter (HOSPITAL_BASED_OUTPATIENT_CLINIC_OR_DEPARTMENT_OTHER)
Admission: RE | Disposition: A | Discharge status: Home / Self Care | Payer: Self-pay | Source: Home / Self Care | Attending: Obstetrics and Gynecology

## 2022-03-16 ENCOUNTER — Encounter (HOSPITAL_BASED_OUTPATIENT_CLINIC_OR_DEPARTMENT_OTHER): Payer: Self-pay | Admitting: Obstetrics and Gynecology

## 2022-03-16 DIAGNOSIS — N8185 Cervical stump prolapse: Secondary | ICD-10-CM | POA: Diagnosis present

## 2022-03-16 DIAGNOSIS — E039 Hypothyroidism, unspecified: Secondary | ICD-10-CM | POA: Diagnosis not present

## 2022-03-16 DIAGNOSIS — N8110 Cystocele, unspecified: Secondary | ICD-10-CM | POA: Diagnosis present

## 2022-03-16 DIAGNOSIS — N72 Inflammatory disease of cervix uteri: Secondary | ICD-10-CM | POA: Diagnosis not present

## 2022-03-16 DIAGNOSIS — Z79890 Hormone replacement therapy (postmenopausal): Secondary | ICD-10-CM | POA: Diagnosis not present

## 2022-03-16 DIAGNOSIS — K589 Irritable bowel syndrome without diarrhea: Secondary | ICD-10-CM | POA: Insufficient documentation

## 2022-03-16 DIAGNOSIS — N393 Stress incontinence (female) (male): Secondary | ICD-10-CM | POA: Diagnosis not present

## 2022-03-16 DIAGNOSIS — Z01818 Encounter for other preprocedural examination: Secondary | ICD-10-CM

## 2022-03-16 DIAGNOSIS — M797 Fibromyalgia: Secondary | ICD-10-CM | POA: Insufficient documentation

## 2022-03-16 HISTORY — DX: Stress incontinence (female) (male): N39.3

## 2022-03-16 HISTORY — PX: TRACHELECTOMY: SHX6586

## 2022-03-16 HISTORY — DX: Prediabetes: R73.03

## 2022-03-16 HISTORY — DX: Hypothyroidism, unspecified: E03.9

## 2022-03-16 HISTORY — DX: Other specified postprocedural states: Z98.890

## 2022-03-16 HISTORY — DX: Hyperlipidemia, unspecified: E78.5

## 2022-03-16 HISTORY — DX: Other specified postprocedural states: R11.2

## 2022-03-16 HISTORY — PX: CYSTOSCOPY: SHX5120

## 2022-03-16 HISTORY — DX: Cervical stump prolapse: N81.85

## 2022-03-16 HISTORY — DX: Presence of spectacles and contact lenses: Z97.3

## 2022-03-16 HISTORY — DX: Personal history of other benign neoplasm: Z86.018

## 2022-03-16 HISTORY — DX: Personal history of other specified conditions: Z87.898

## 2022-03-16 HISTORY — DX: Cystocele, unspecified: N81.10

## 2022-03-16 HISTORY — PX: ROBOTIC ASSISTED LAPAROSCOPIC SACROCOLPOPEXY: SHX5388

## 2022-03-16 HISTORY — PX: BLADDER SUSPENSION: SHX72

## 2022-03-16 LAB — CBC
HCT: 47 % — ABNORMAL HIGH (ref 36.0–46.0)
Hemoglobin: 15.6 g/dL — ABNORMAL HIGH (ref 12.0–15.0)
MCH: 28.9 pg (ref 26.0–34.0)
MCHC: 33.2 g/dL (ref 30.0–36.0)
MCV: 87 fL (ref 80.0–100.0)
Platelets: 274 10*3/uL (ref 150–400)
RBC: 5.4 MIL/uL — ABNORMAL HIGH (ref 3.87–5.11)
RDW: 11.9 % (ref 11.5–15.5)
WBC: 4.5 10*3/uL (ref 4.0–10.5)
nRBC: 0 % (ref 0.0–0.2)

## 2022-03-16 LAB — TYPE AND SCREEN
ABO/RH(D): A POS
Antibody Screen: NEGATIVE

## 2022-03-16 LAB — ABO/RH: ABO/RH(D): A POS

## 2022-03-16 SURGERY — TRACHELECTOMY
Anesthesia: General | Site: Vagina

## 2022-03-16 MED ORDER — PROPOFOL 10 MG/ML IV BOLUS
INTRAVENOUS | Status: DC | PRN
  Administered 2022-03-16: 150 mg via INTRAVENOUS

## 2022-03-16 MED ORDER — MIDAZOLAM HCL 2 MG/2ML IJ SOLN
INTRAMUSCULAR | Status: AC
  Filled 2022-03-16: qty 2

## 2022-03-16 MED ORDER — DEXMEDETOMIDINE HCL IN NACL 80 MCG/20ML IV SOLN
INTRAVENOUS | Status: DC | PRN
  Administered 2022-03-16: 12 ug via BUCCAL

## 2022-03-16 MED ORDER — LIDOCAINE HCL (PF) 2 % IJ SOLN
INTRAMUSCULAR | Status: AC
  Filled 2022-03-16: qty 5

## 2022-03-16 MED ORDER — ONDANSETRON HCL 4 MG/2ML IJ SOLN: 4.0000 mg | Freq: Four times a day (QID) | INTRAMUSCULAR | Status: DC | PRN

## 2022-03-16 MED ORDER — PROPOFOL 10 MG/ML IV BOLUS
INTRAVENOUS | Status: AC
  Filled 2022-03-16: qty 20

## 2022-03-16 MED ORDER — LACTATED RINGERS IV SOLN: INTRAVENOUS | Status: DC

## 2022-03-16 MED ORDER — ROCURONIUM BROMIDE 10 MG/ML (PF) SYRINGE
PREFILLED_SYRINGE | INTRAVENOUS | Status: AC
  Filled 2022-03-16: qty 10

## 2022-03-16 MED ORDER — KETOROLAC TROMETHAMINE 30 MG/ML IJ SOLN
INTRAMUSCULAR | Status: DC | PRN
  Administered 2022-03-16: 30 mg via INTRAVENOUS

## 2022-03-16 MED ORDER — LIDOCAINE 2% (20 MG/ML) 5 ML SYRINGE
INTRAMUSCULAR | Status: DC | PRN
  Administered 2022-03-16: 100 mg via INTRAVENOUS

## 2022-03-16 MED ORDER — SCOPOLAMINE 1 MG/3DAYS TD PT72
1.0000 | MEDICATED_PATCH | TRANSDERMAL | Status: DC
  Administered 2022-03-16: 1.5 mg via TRANSDERMAL

## 2022-03-16 MED ORDER — AMISULPRIDE (ANTIEMETIC) 5 MG/2ML IV SOLN: 10.0000 mg | Freq: Once | INTRAVENOUS | Status: DC | PRN

## 2022-03-16 MED ORDER — GABAPENTIN 300 MG PO CAPS
300.0000 mg | ORAL_CAPSULE | ORAL | Status: AC
  Administered 2022-03-16: 300 mg via ORAL

## 2022-03-16 MED ORDER — DEXAMETHASONE SODIUM PHOSPHATE 10 MG/ML IJ SOLN
INTRAMUSCULAR | Status: DC | PRN
  Administered 2022-03-16: 10 mg via INTRAVENOUS

## 2022-03-16 MED ORDER — CEFAZOLIN SODIUM-DEXTROSE 2-4 GM/100ML-% IV SOLN
2.0000 g | INTRAVENOUS | Status: AC
  Administered 2022-03-16: 2 g via INTRAVENOUS

## 2022-03-16 MED ORDER — PHENYLEPHRINE 80 MCG/ML (10ML) SYRINGE FOR IV PUSH (FOR BLOOD PRESSURE SUPPORT)
PREFILLED_SYRINGE | INTRAVENOUS | Status: DC | PRN
  Administered 2022-03-16 (×2): 80 ug via INTRAVENOUS
  Administered 2022-03-16 (×2): 160 ug via INTRAVENOUS

## 2022-03-16 MED ORDER — SIMETHICONE 80 MG PO CHEW: 80.0000 mg | CHEWABLE_TABLET | Freq: Four times a day (QID) | ORAL | Status: DC | PRN

## 2022-03-16 MED ORDER — ACETAMINOPHEN 325 MG PO TABS
650.0000 mg | ORAL_TABLET | ORAL | Status: DC | PRN
  Administered 2022-03-17 (×2): 650 mg via ORAL

## 2022-03-16 MED ORDER — GABAPENTIN 300 MG PO CAPS
ORAL_CAPSULE | ORAL | Status: AC
  Filled 2022-03-16: qty 1

## 2022-03-16 MED ORDER — PHENYLEPHRINE HCL (PRESSORS) 10 MG/ML IV SOLN
INTRAVENOUS | Status: AC
  Filled 2022-03-16: qty 1

## 2022-03-16 MED ORDER — LIDOCAINE-EPINEPHRINE 1 %-1:100000 IJ SOLN
INTRAMUSCULAR | Status: AC
  Filled 2022-03-16: qty 1

## 2022-03-16 MED ORDER — WATER FOR IRRIGATION, STERILE IR SOLN
Status: DC | PRN
  Administered 2022-03-16: 500 mL

## 2022-03-16 MED ORDER — OXYCODONE HCL 5 MG PO TABS
ORAL_TABLET | ORAL | Status: AC
  Filled 2022-03-16: qty 1

## 2022-03-16 MED ORDER — ACETAMINOPHEN 500 MG PO TABS
ORAL_TABLET | ORAL | Status: AC
  Filled 2022-03-16: qty 2

## 2022-03-16 MED ORDER — LIDOCAINE-EPINEPHRINE 1 %-1:100000 IJ SOLN
INTRAMUSCULAR | Status: DC | PRN
  Administered 2022-03-16: 15 mL

## 2022-03-16 MED ORDER — OXYCODONE HCL 5 MG PO TABS: 5.0000 mg | ORAL_TABLET | Freq: Once | ORAL | Status: DC | PRN

## 2022-03-16 MED ORDER — FENTANYL CITRATE (PF) 100 MCG/2ML IJ SOLN
INTRAMUSCULAR | Status: DC | PRN
  Administered 2022-03-16 (×4): 50 ug via INTRAVENOUS

## 2022-03-16 MED ORDER — ROCURONIUM BROMIDE 10 MG/ML (PF) SYRINGE
PREFILLED_SYRINGE | INTRAVENOUS | Status: DC | PRN
  Administered 2022-03-16: 10 mg via INTRAVENOUS
  Administered 2022-03-16: 70 mg via INTRAVENOUS
  Administered 2022-03-16: 20 mg via INTRAVENOUS

## 2022-03-16 MED ORDER — OXYCODONE HCL 5 MG PO TABS
5.0000 mg | ORAL_TABLET | ORAL | Status: DC | PRN
  Administered 2022-03-16 (×3): 5 mg via ORAL
  Administered 2022-03-17 (×3): 10 mg via ORAL

## 2022-03-16 MED ORDER — SODIUM CHLORIDE 0.9 % IR SOLN
Status: DC | PRN
  Administered 2022-03-16: 300 mL
  Administered 2022-03-16: 200 mL via INTRAVESICAL

## 2022-03-16 MED ORDER — ONDANSETRON HCL 4 MG PO TABS: 4.0000 mg | ORAL_TABLET | Freq: Four times a day (QID) | ORAL | Status: DC | PRN

## 2022-03-16 MED ORDER — ACETAMINOPHEN 500 MG PO TABS
1000.0000 mg | ORAL_TABLET | ORAL | Status: AC
  Administered 2022-03-16: 1000 mg via ORAL

## 2022-03-16 MED ORDER — LIDOCAINE HCL 1 % IJ SOLN
INTRAMUSCULAR | Status: AC
  Filled 2022-03-16: qty 20

## 2022-03-16 MED ORDER — BUPIVACAINE HCL (PF) 0.25 % IJ SOLN
INTRAMUSCULAR | Status: DC | PRN
  Administered 2022-03-16: 24 mL

## 2022-03-16 MED ORDER — CEFAZOLIN SODIUM-DEXTROSE 2-4 GM/100ML-% IV SOLN
INTRAVENOUS | Status: AC
  Filled 2022-03-16: qty 100

## 2022-03-16 MED ORDER — FENTANYL CITRATE (PF) 250 MCG/5ML IJ SOLN
INTRAMUSCULAR | Status: AC
  Filled 2022-03-16: qty 5

## 2022-03-16 MED ORDER — SCOPOLAMINE 1 MG/3DAYS TD PT72
MEDICATED_PATCH | TRANSDERMAL | Status: AC
  Filled 2022-03-16: qty 1

## 2022-03-16 MED ORDER — ONDANSETRON HCL 4 MG/2ML IJ SOLN: 4.0000 mg | Freq: Once | INTRAMUSCULAR | Status: DC | PRN

## 2022-03-16 MED ORDER — BUPIVACAINE HCL (PF) 0.25 % IJ SOLN
INTRAMUSCULAR | Status: AC
  Filled 2022-03-16: qty 30

## 2022-03-16 MED ORDER — PHENAZOPYRIDINE HCL 100 MG PO TABS
200.0000 mg | ORAL_TABLET | ORAL | Status: AC
  Administered 2022-03-16: 200 mg via ORAL

## 2022-03-16 MED ORDER — OXYCODONE HCL 5 MG/5ML PO SOLN: 5.0000 mg | Freq: Once | ORAL | Status: DC | PRN

## 2022-03-16 MED ORDER — ONDANSETRON HCL 4 MG/2ML IJ SOLN
INTRAMUSCULAR | Status: AC
  Filled 2022-03-16: qty 2

## 2022-03-16 MED ORDER — POVIDONE-IODINE 10 % EX SWAB: 2.0000 | Freq: Once | CUTANEOUS | Status: DC

## 2022-03-16 MED ORDER — ACETAMINOPHEN 500 MG PO TABS: 1000.0000 mg | ORAL_TABLET | Freq: Once | ORAL | Status: DC

## 2022-03-16 MED ORDER — PHENYLEPHRINE HCL-NACL 20-0.9 MG/250ML-% IV SOLN
INTRAVENOUS | Status: DC | PRN
  Administered 2022-03-16: 25 ug/min via INTRAVENOUS

## 2022-03-16 MED ORDER — FENTANYL CITRATE (PF) 100 MCG/2ML IJ SOLN: 25.0000 ug | INTRAMUSCULAR | Status: DC | PRN

## 2022-03-16 MED ORDER — PHENAZOPYRIDINE HCL 100 MG PO TABS
ORAL_TABLET | ORAL | Status: AC
  Filled 2022-03-16: qty 2

## 2022-03-16 MED ORDER — DEXAMETHASONE SODIUM PHOSPHATE 10 MG/ML IJ SOLN
INTRAMUSCULAR | Status: AC
  Filled 2022-03-16: qty 1

## 2022-03-16 MED ORDER — MIDAZOLAM HCL 5 MG/5ML IJ SOLN
INTRAMUSCULAR | Status: DC | PRN
  Administered 2022-03-16: 2 mg via INTRAVENOUS

## 2022-03-16 MED ORDER — ONDANSETRON HCL 4 MG/2ML IJ SOLN
INTRAMUSCULAR | Status: DC | PRN
  Administered 2022-03-16: 4 mg via INTRAVENOUS

## 2022-03-16 SURGICAL SUPPLY — 93 items
ADH SKN CLS APL DERMABOND .7 (GAUZE/BANDAGES/DRESSINGS) ×3
AGENT HMST KT MTR STRL THRMB (HEMOSTASIS)
APL ESCP 34 STRL LF DISP (HEMOSTASIS)
APL PRP STRL LF DISP 70% ISPRP (MISCELLANEOUS) ×3
APPLICATOR SURGIFLO ENDO (HEMOSTASIS) IMPLANT
BLADE CLIPPER SENSICLIP SURGIC (BLADE) ×4 IMPLANT
BLADE SURG 15 STRL LF DISP TIS (BLADE) ×4 IMPLANT
BLADE SURG 15 STRL SS (BLADE) ×3
CATH FOLEY 3WAY  5CC 16FR (CATHETERS) ×3
CATH FOLEY 3WAY 5CC 16FR (CATHETERS) ×4 IMPLANT
CHLORAPREP W/TINT 26 (MISCELLANEOUS) ×4 IMPLANT
COVER BACK TABLE 60X90IN (DRAPES) ×4 IMPLANT
COVER TIP SHEARS 8 DVNC (MISCELLANEOUS) ×4 IMPLANT
COVER TIP SHEARS 8MM DA VINCI (MISCELLANEOUS) ×3
DEFOGGER SCOPE WARMER CLEARIFY (MISCELLANEOUS) ×4 IMPLANT
DERMABOND ADVANCED .7 DNX12 (GAUZE/BANDAGES/DRESSINGS) ×4 IMPLANT
DEVICE CAPIO SLIM SINGLE (INSTRUMENTS) IMPLANT
DRAPE ARM DVNC X/XI (DISPOSABLE) ×16 IMPLANT
DRAPE COLUMN DVNC XI (DISPOSABLE) ×4 IMPLANT
DRAPE DA VINCI XI ARM (DISPOSABLE) ×12
DRAPE DA VINCI XI COLUMN (DISPOSABLE) ×3
DRAPE SHEET LG 3/4 BI-LAMINATE (DRAPES) ×4 IMPLANT
DRAPE UTILITY XL STRL (DRAPES) ×4 IMPLANT
ELECT REM PT RETURN 9FT ADLT (ELECTROSURGICAL) ×3
ELECTRODE REM PT RTRN 9FT ADLT (ELECTROSURGICAL) ×4 IMPLANT
GAUZE 4X4 16PLY ~~LOC~~+RFID DBL (SPONGE) ×8 IMPLANT
GLOVE BIO SURGEON STRL SZ 6 (GLOVE) ×12 IMPLANT
GLOVE BIOGEL PI IND STRL 6.5 (GLOVE) ×16 IMPLANT
GLOVE BIOGEL PI IND STRL 7.0 (GLOVE) ×8 IMPLANT
GLOVE ECLIPSE 6.0 STRL STRAW (GLOVE) ×4 IMPLANT
GOWN STRL REUS W/TWL LRG LVL3 (GOWN DISPOSABLE) ×4 IMPLANT
HIBICLENS CHG 4% 4OZ BTL (MISCELLANEOUS) ×4 IMPLANT
HOLDER FOLEY CATH W/STRAP (MISCELLANEOUS) ×4 IMPLANT
IRRIG SUCT STRYKERFLOW 2 WTIP (MISCELLANEOUS) ×3
IRRIGATION SUCT STRKRFLW 2 WTP (MISCELLANEOUS) ×4 IMPLANT
KIT TURNOVER CYSTO (KITS) ×4 IMPLANT
LEGGING LITHOTOMY PAIR STRL (DRAPES) ×4 IMPLANT
MANIFOLD NEPTUNE II (INSTRUMENTS) ×4 IMPLANT
MANIPULATOR ADVINCU DEL 2.5 PL (MISCELLANEOUS) IMPLANT
MANIPULATOR ADVINCU DEL 3.0 PL (MISCELLANEOUS) IMPLANT
MANIPULATOR ADVINCU DEL 3.5 PL (MISCELLANEOUS) IMPLANT
MANIPULATOR ADVINCU DEL 4.0 PL (MISCELLANEOUS) IMPLANT
MESH VERTESSA LITE -Y 2X4X3 (Mesh General) ×4 IMPLANT
NDL INSUFFLATION 14GA 120MM (NEEDLE) ×4 IMPLANT
NEEDLE HYPO 22GX1.5 SAFETY (NEEDLE) ×4 IMPLANT
NEEDLE INSUFFLATION 14GA 120MM (NEEDLE) ×3 IMPLANT
NS IRRIG 1000ML POUR BTL (IV SOLUTION) ×4 IMPLANT
OBTURATOR OPTICAL STANDARD 8MM (TROCAR) ×3
OBTURATOR OPTICAL STND 8 DVNC (TROCAR) ×3
OBTURATOR OPTICALSTD 8 DVNC (TROCAR) ×4 IMPLANT
PACK CYSTO (CUSTOM PROCEDURE TRAY) ×4 IMPLANT
PACK ROBOT WH (CUSTOM PROCEDURE TRAY) ×4 IMPLANT
PACK ROBOTIC GOWN (GOWN DISPOSABLE) ×4 IMPLANT
PACK VAGINAL WOMENS (CUSTOM PROCEDURE TRAY) ×4 IMPLANT
PAD OB MATERNITY 4.3X12.25 (PERSONAL CARE ITEMS) ×4 IMPLANT
PAD POSITIONING PINK XL (MISCELLANEOUS) ×4 IMPLANT
PAD PREP 24X48 CUFFED NSTRL (MISCELLANEOUS) ×4 IMPLANT
POUCH LAPAROSCOPIC INSTRUMENT (MISCELLANEOUS) IMPLANT
PROTECTOR NERVE ULNAR (MISCELLANEOUS) ×4 IMPLANT
RETRACTOR LONE STAR DISPOSABLE (INSTRUMENTS) ×4 IMPLANT
RETRACTOR STAY HOOK 5MM (MISCELLANEOUS) ×4 IMPLANT
SEAL CANN UNIV 5-8 DVNC XI (MISCELLANEOUS) ×20 IMPLANT
SEAL XI 5MM-8MM UNIVERSAL (MISCELLANEOUS) ×15
SEALER VESSEL DA VINCI XI (MISCELLANEOUS)
SEALER VESSEL EXT DVNC XI (MISCELLANEOUS) IMPLANT
SET IRRIG Y TYPE TUR BLADDER L (SET/KITS/TRAYS/PACK) ×4 IMPLANT
SET TUBE SMOKE EVAC HIGH FLOW (TUBING) ×4 IMPLANT
SPIKE FLUID TRANSFER (MISCELLANEOUS) ×8 IMPLANT
SPONGE T-LAP 4X18 ~~LOC~~+RFID (SPONGE) IMPLANT
SUCTION FRAZIER HANDLE 10FR (MISCELLANEOUS) ×3
SUCTION TUBE FRAZIER 10FR DISP (MISCELLANEOUS) ×4 IMPLANT
SURGIFLO W/THROMBIN 8M KIT (HEMOSTASIS) IMPLANT
SUT ABS MONO DBL WITH NDL 48IN (SUTURE) IMPLANT
SUT GORETEX NAB #0 THX26 36IN (SUTURE) ×4 IMPLANT
SUT MNCRL AB 4-0 PS2 18 (SUTURE) ×4 IMPLANT
SUT MON AB 2-0 SH 27 (SUTURE) ×4 IMPLANT
SUT V-LOC BARB 180 2/0GR6 GS22 (SUTURE) ×3
SUT V-LOC BARB 180 2/0GR9 GS23 (SUTURE) ×6
SUT VIC AB 0 CT1 27 (SUTURE) ×12
SUT VIC AB 0 CT1 27XBRD ANTBC (SUTURE) ×8 IMPLANT
SUT VIC AB 0 CT1 27XCR 8 STRN (SUTURE) IMPLANT
SUT VIC AB 2-0 SH 27 (SUTURE) ×6
SUT VIC AB 2-0 SH 27XBRD (SUTURE) ×4 IMPLANT
SUT VICRYL 2-0 SH 8X27 (SUTURE) IMPLANT
SUT VLOC 180 0 9IN  GS21 (SUTURE)
SUT VLOC 180 0 9IN GS21 (SUTURE) IMPLANT
SUT VLOC 180 2-0 9IN GS21 (SUTURE) IMPLANT
SUTURE V-LC BRB 180 2/0GR6GS22 (SUTURE) IMPLANT
SUTURE V-LC BRB 180 2/0GR9GS23 (SUTURE) ×8 IMPLANT
SYR BULB EAR ULCER 3OZ GRN STR (SYRINGE) ×4 IMPLANT
SYS SLING ADV FIT BLUE TRNSVAG (Sling) IMPLANT
TOWEL OR 17X26 10 PK STRL BLUE (TOWEL DISPOSABLE) ×4 IMPLANT
TRAY FOLEY W/BAG SLVR 14FR (SET/KITS/TRAYS/PACK) ×4 IMPLANT

## 2022-03-16 NOTE — Interval H&P Note (Signed)
History and Physical Interval Note:  03/16/2022 11:18 AM  Valerie Vasquez  has presented today for surgery, with the diagnosis of cervical stump prolapse; cystocele; stress urinary incontinence.  The various methods of treatment have been discussed with the patient and family. After consideration of risks, benefits and other options for treatment, the patient has consented to  Procedure(s) with comments: TRACHELECTOMY (N/A)  XI ROBOTIC ASSISTED LAPAROSCOPIC SACROCOLPOPEXY (N/A) TRANSVAGINAL TAPE (TVT) PROCEDURE (N/A) CYSTOSCOPY (N/A) as a surgical intervention.  The patient's history has been reviewed, patient examined, no change in status, stable for surgery.  I have reviewed the patient's chart and labs.  Questions were answered to the patient's satisfaction.     Jaquita Folds

## 2022-03-16 NOTE — Discharge Instructions (Signed)

## 2022-03-16 NOTE — Anesthesia Procedure Notes (Signed)
Procedure Name: Intubation Date/Time: 03/16/2022 12:35 PM  Performed by: Bonney Aid, CRNAPre-anesthesia Checklist: Patient identified, Emergency Drugs available, Suction available and Patient being monitored Patient Re-evaluated:Patient Re-evaluated prior to induction Oxygen Delivery Method: Circle system utilized Preoxygenation: Pre-oxygenation with 100% oxygen Induction Type: IV induction Ventilation: Mask ventilation without difficulty Laryngoscope Size: Mac and 3 Grade View: Grade I Tube type: Oral Tube size: 7.0 mm Number of attempts: 1 Airway Equipment and Method: Stylet Placement Confirmation: positive ETCO2 Secured at: 19 cm Tube secured with: Tape Dental Injury: Teeth and Oropharynx as per pre-operative assessment

## 2022-03-16 NOTE — Anesthesia Preprocedure Evaluation (Signed)
Anesthesia Evaluation  Patient identified by MRN, date of birth, ID band Patient awake    Reviewed: Allergy & Precautions, NPO status , Patient's Chart, lab work & pertinent test results  History of Anesthesia Complications (+) PONV and history of anesthetic complications  Airway Mallampati: II  TM Distance: >3 FB Neck ROM: Full    Dental  (+) Dental Advisory Given, Teeth Intact   Pulmonary neg pulmonary ROS   Pulmonary exam normal        Cardiovascular negative cardio ROS Normal cardiovascular exam  Saw Cardiology in July for atypical chest pain. Deemed likely non cardiac. No further workup.   Neuro/Psych negative neurological ROS     GI/Hepatic negative GI ROS, Neg liver ROS,,,  Endo/Other  Hypothyroidism    Renal/GU negative Renal ROS  negative genitourinary   Musculoskeletal  (+)  Fibromyalgia -  Abdominal   Peds  Hematology negative hematology ROS (+)   Anesthesia Other Findings   Reproductive/Obstetrics cervical stump prolapse; cystocele; stress urinary incontinence                             Anesthesia Physical Anesthesia Plan  ASA: 2  Anesthesia Plan: General   Post-op Pain Management: Tylenol PO (pre-op)* and Toradol IV (intra-op)*   Induction: Intravenous  PONV Risk Score and Plan: 4 or greater and Ondansetron, Dexamethasone, Treatment may vary due to age or medical condition, Midazolam and Scopolamine patch - Pre-op  Airway Management Planned: Oral ETT  Additional Equipment: None  Intra-op Plan:   Post-operative Plan: Extubation in OR  Informed Consent: I have reviewed the patients History and Physical, chart, labs and discussed the procedure including the risks, benefits and alternatives for the proposed anesthesia with the patient or authorized representative who has indicated his/her understanding and acceptance.     Dental advisory given  Plan Discussed  with:   Anesthesia Plan Comments:         Anesthesia Quick Evaluation

## 2022-03-16 NOTE — Progress Notes (Signed)
C/O left eye itching and watering. Patient given syringe with Normal saline and allowed her to flush eye. Stated immediate relief obtained

## 2022-03-16 NOTE — Transfer of Care (Signed)
Immediate Anesthesia Transfer of Care Note  Patient: Valerie Vasquez  Procedure(s) Performed: TRACHELECTOMY (Vagina ) XI ROBOTIC ASSISTED LAPAROSCOPIC SACROCOLPOPEXY (Pelvis) TRANSVAGINAL TAPE (TVT) PROCEDURE (Vagina ) CYSTOSCOPY (Bladder)  Patient Location: PACU  Anesthesia Type:General  Level of Consciousness: sedated  Airway & Oxygen Therapy: Patient Spontanous Breathing and Patient connected to nasal cannula oxygen  Post-op Assessment: Report given to RN  Post vital signs: Reviewed and stable  Last Vitals:  Vitals Value Taken Time  BP 95/55 03/16/22 1533  Temp    Pulse 71 03/16/22 1535  Resp 8 03/16/22 1535  SpO2 93 % 03/16/22 1535  Vitals shown include unvalidated device data.  Last Pain:  Vitals:   03/16/22 1102  TempSrc: Oral  PainSc: 4       Patients Stated Pain Goal: 6 (28/00/34 9179)  Complications: No notable events documented.

## 2022-03-16 NOTE — Anesthesia Postprocedure Evaluation (Signed)
Anesthesia Post Note  Patient: Valerie Vasquez  Procedure(s) Performed: TRACHELECTOMY (Vagina ) XI ROBOTIC ASSISTED LAPAROSCOPIC SACROCOLPOPEXY (Pelvis) TRANSVAGINAL TAPE (TVT) PROCEDURE (Vagina ) CYSTOSCOPY (Bladder)     Patient location during evaluation: PACU Anesthesia Type: General Level of consciousness: awake and alert Pain management: pain level controlled Vital Signs Assessment: post-procedure vital signs reviewed and stable Respiratory status: spontaneous breathing, nonlabored ventilation and respiratory function stable Cardiovascular status: blood pressure returned to baseline and stable Postop Assessment: no apparent nausea or vomiting Anesthetic complications: no   No notable events documented.  Last Vitals:  Vitals:   03/16/22 1533 03/16/22 1545  BP: (!) 95/55 (!) 101/56  Pulse: 68 73  Resp: 11 14  Temp: (!) 36.3 C   SpO2: 93% 94%    Last Pain:  Vitals:   03/16/22 1533  TempSrc:   PainSc: 0-No pain                 Lidia Collum

## 2022-03-16 NOTE — Op Note (Signed)
Operative Note  Preoperative Diagnosis: anterior vaginal prolapse, stress urinary incontinence, and cervical stump prolapse  Postoperative Diagnosis: same  Procedures performed:  Trachelectomy, midurethral sling (advantage fit), robotic assisted sacrocolpopexy [name]), cystoscopy  Implants:  Implant Name Type Inv. Item Serial No. Manufacturer Lot No. LRB No. Used Action  SYS SLING ADV FIT BLUE TRNSVAG - [id number] Sling SYS SLING ADV FIT BLUE TRNSVAG  [employer] [id number] N/A 1 Implanted  MESH Valli Glance [id number] - Jasper [id number] N/A 1 Implanted    Attending Surgeon: Sherlene Shams, MD  Anesthesia: General endotracheal  Findings: 1. On vaginal exam, stage II prolapse was noted  2. On laparoscopy, no adhesions were present  3. On cystoscopy, normal bladder and urethral mucosa without injury, lesion or foreign body. Brisk bilateral ureteral efflux was noted.     Specimens:  ID Type Source Tests Collected by Time Destination  1 : cervix Tissue PATH Gyn benign resection SURGICAL PATHOLOGY Jaquita Folds, MD 03/16/2022 1306     Estimated blood loss: 50 mL  IV fluids: 1000 mL  Urine output: 132 mL  Complications: none  Procedure in Detail:  After informed consent was obtained, the patient was taken to the operating room, where general anesthesia was induced and found to be adequate. She was placed in dorsolithotomy position in yellowfin stirrups. Her hips were noted not to be hyperflexed or hyperextended. Her arms were padded with gel pads and tucked to her sides. Her hands were surrounded by foam. A padded strap was placed across her chest with foam between the pad and her skin. She was noted to be appropriately positioned with all pressure points well padded and off tension. A tilt test showed no slippage. She was prepped and draped in the usual sterile fashion.    A self-retaining  retractor was placed, and a foley catheter was placed. The cervix was grasped with two single-tooth tenacula.  The cervix was injected circumferentially with 1% lidocaine with epinephrine. A 10 blade was used to incise circumferentially around the cervix.  The posterior vagina was grasped with a Kocher clamp. The Mayo scissors were used to enter the posterior cul-de-sac. Palpation confirmed peritoneal entry and no adhesions. The posterior peritoneum was affixed to the vaginal cuff with 0-Vicryl (this suture was used throughout unless otherwise specified) at the midline.  Anteriorly, the bladder was dissected off the pubocervical fascia using Metzenbaum scissors and the peritoneum was entered. A Deaver retractor was placed anteriorly to protect the bladder. A Heaney clamp was then used to clamp the uterosacral ligaments on each side. These were cut and suture ligated using 0-Vicryl in a Heaney fashion. The remaining ligaments were clamped, cut, and suture ligated in a similar fashion on each side. The cervix was handed off the field.  Inspection of the pedicles revealed excellent hemostasis. The cuff was then closed with 0-vicryl interrupted figure of eight sutures.    The mid urethral area was located on the anterior vaginal wall.  Two Allis clamps were placed at the level of the midurethra. 1% lidocaine with epinephrine was injected into the vaginal mucosa. A vertical incision was made between the two clamps using a 15-blade scalpel.  Using sharp dissection, Metzenbaum scissors were used to make a periurethral tunnel from the vaginal incision towards the pubic rami bilaterally for the future sling tracts. The bladder was ensured to be empty. The trocar and attached sling were introduced into the right  side of the periurethral vaginal incision, just inferior to the pubic symphysis on the right side. The trocar was guided through the endopelvic fascia and directly vertically.  While hugging the cephalad surface of  the pubic bone, the trocar was guided out through the abdomen 2 fingerbreadths lateral to midline at the level of the pubic symphysis on the ipsilateral side. The trocar was placed on the left side in a similar fashion.  A 70-degree cystoscope was introduced, and 360-degree inspection revealed no trauma or trocars in the bladder, with brisk bilateral ureteral efflux.  The bladder was drained and the cystoscope was removed.  The Foley catheter was reinserted.  The sling was brought to lie beneath the mid-urethra.  A needle driver was placed behind the sling to ensure no tension.   The plastic sheath was removed from the sling and the distal ends of the sling were trimmed just below the level of the skin incisions.  Tension-free positioning of the sling was confirmed. Vaginal inspection revealed no vaginotomy or sling perforations of the mucosa.  The vaginal mucosal edges were reapproximated using 2-0 Vicryl.  The vagina was copiously irrigated.  Hemostasis was again noted.   Attention was then turned to the abdomen. 0.25% plain Marcaine was injected into the umbilicus and an incision was made with a scalpel. A Veress needle was inserted into the incision, CO2 insufflation was started, a low opening pressure was noted, and pneumoperitoneum was obtained. The Veress needle was removed and a 70m robotic trocar was placed. Entry into the peritoneal cavity was confirmed. No adhesions were noted. After determining placement for the other ports, Local anesthetic was injected at each site and two 8 mm incisions were made for robotic ports at 10 cm lateral to and at the level of the umbilical port. Two additional 8 mm incisions were made 10 cm lateral to these and 30 degrees down followed by 8 mm robotic ports - the right side for an assistant port. All trocars were placed sequentially under direct visualization of the camera. The patient was placed in Trendelenburg. The sacrum appeared to be free of any adhesive disease.The  robot was docked on the patient's right side. Monopolar endoshears were placed in the right arm, a [place] bipolar grasper was placed in the 2nd arm of the patient's left side, and a Tip up grasper was placed in the 3rd arm on the patient's left side.    With a lucite probe in the vagina, anterior vaginal dissection was then performed with sharp dissection and electrosurgery to separate the vesicovaginal space to the level of the urethra. The posterior vaginal dissection was then performed with sharp dissection and electrosurgery in order to dissect the rectum away from the posterior vagina. The  Attention was then turned to the sacral promontory. The peritoneum overlying the sacral promontory was tented up, dissected sharply with monopolar scissors and electrosurgery using layer by layer technique. The overlying areolar and adipose tissue were taken down until the anterior longitudinal sacral ligament was identified. The peritoneal incision was extended down to the posterior cul-de-sac. This was performed with care to avoid the ureter on the right side and the sigmoid colon and its mesentary on the left side.   Small vessels were cauterized along the way to obtain excellent hemostasis. A "Y" mesh was then inserted into the abdomen after trimming to appropriate size. With the probe in the vagina, the anterior leaf of the Y mesh was affixed to the anterior portion of the vagina  using a 2-0 v-loc suture in a spiral pattern to distribute the suture evenly across the surface of the anterior mesh leaf. In a similar fashion, the posterior leaf of the Y mesh was attached to the posterior surface of the vagina with 2-0 v-loc suture.  The distal end of the mesh was then brought to overlie the sacrum. The correct amount of tension was determined in order to elevate the vagina, but not put the mesh under tension. The distal end of the mesh was then affixed to the anterior longitudinal sacral ligament using three interrupted  transverse stitches of CV2 Gortex. The excess distal mesh was then cut and removed. The peritoneum was reapproximated over the mesh using 2-0 monocryl. The bladder flap was incorporated to completely retroperitonealize the mesh. All pedicles were carefully inspected and noted to be hemostatic as the CO2 gas was deflated. All instruments were removed from the patient's abdomen.    The Foley catheter was removed.  A 70-degree cystoscope was introduced, and 360-degree inspection revealed no injury, lesion or foreign body in the bladder. Brisk bilateral ureteral efflux was noted with the assistance of pyridium.  The bladder was drained and the cystoscope was removed.  The Foley catheter was replaced.   The robot was undocked. The CO2 gas was removed and the ports were removed.  The skin incisions were closed with subcutaneous stitches of 4-0 Monocryl. All incisions were covered with skin glue. The patient tolerated the procedure well. Sponge, lap, and needle counts were correct x 2. She was awakened from anesthesia and transferred to the recovery room in stable condition.   Jaquita Folds, MD

## 2022-03-17 DIAGNOSIS — N8110 Cystocele, unspecified: Secondary | ICD-10-CM | POA: Diagnosis not present

## 2022-03-17 MED ORDER — ACETAMINOPHEN 325 MG PO TABS
ORAL_TABLET | ORAL | Status: AC
  Filled 2022-03-17: qty 2

## 2022-03-17 MED ORDER — OXYCODONE HCL 5 MG PO TABS
ORAL_TABLET | ORAL | Status: AC
  Filled 2022-03-17: qty 2

## 2022-03-17 NOTE — Discharge Summary (Signed)
Physician Discharge Summary  Patient ID: Valerie Vasquez MRN: [id number] DOB/AGE: Jun 25, 1960 61 y.o.  Admit date: 03/16/2022 Discharge date: 03/17/2022  Admission Diagnoses:  Discharge Diagnoses:  Principal Problem:   Prolapse of anterior vaginal wall Active Problems:   Cervical stump prolapse   Discharged Condition: good  Hospital Course: 61yo F admitted for trachelectomy, robotic assisted sacrocolpopexy, midurethral sling, cystoscopy. On POD#1 she was tolerating diet, ambulating and she passed her voiding trial. For the voiding trial, 300cc was backfilled into the bladder. She voided 350cc and bladder scan was 0. Her pain was well controlled and it was determined she was stable for discharge.   Consults: None  Significant Diagnostic Studies: n/a  Treatments: see above  Discharge Exam: Blood pressure (!) 107/53, pulse 87, temperature 98.4 F (36.9 C), resp. rate 18, height '5\' 1"'$  (1.549 m), weight 88.4 kg, SpO2 93 %. General appearance: alert and cooperative Resp: normal respirations GI: soft, non-tender; bowel sounds normal; no masses,  no organomegaly and incisions clean/ dry and intact with surrounding ecchymosis Extremities: extremities normal, atraumatic, no cyanosis or edema  Disposition: Discharge disposition: 01-Home or Self Care       Discharge Instructions     Call MD for:  persistant nausea and vomiting   Complete by: As directed    Call MD for:  redness, tenderness, or signs of infection (pain, swelling, redness, odor or green/yellow discharge around incision site)   Complete by: As directed    Call MD for:  severe uncontrolled pain   Complete by: As directed    Call MD for:  temperature >100.4   Complete by: As directed    Diet general   Complete by: As directed    Increase activity slowly   Complete by: As directed    May walk up steps   Complete by: As directed       Allergies as of 03/17/2022       Reactions   Codeine    High doses cause  severe chest tightness   Diazepam    High doses cause sever chest tightness   Gluten Meal    Gluten free diet   Morphine    High doses cause severe chest tightness   Topiramate Other (See Comments)   Per pt suicidal        Medication List     TAKE these medications    acetaminophen 500 MG tablet Commonly known as: TYLENOL Take 1 tablet (500 mg total) by mouth every 6 (six) hours as needed (pain).   ADRENAL PO Take by mouth daily. Adrenal support   Armour Thyroid 30 MG tablet Generic drug: thyroid Take 30 mg by mouth daily before breakfast.   Armour Thyroid 15 MG tablet Generic drug: thyroid Take 15 mg by mouth daily. Pt takes both '30mg'$  and '15mg'$  = '45mg'$    B Complex Plus Tabs Take by mouth daily.   Brain Might/DHA & CO Q10 Tabs Take by mouth daily at 12 noon. BRAIN SUPPORT   DAILY PROBIOTIC SUPPLEMENT PO Take by mouth daily.   DHEA ADVANCED FORMULA PO Take by mouth daily.   DULoxetine 30 MG capsule Commonly known as: CYMBALTA Take 30 mg by mouth daily.   ibuprofen 600 MG tablet Commonly known as: ADVIL Take 1 tablet (600 mg total) by mouth every 6 (six) hours as needed.   MALIC ACID PO Take by mouth daily. W/  MAGNESIUM   metaxalone 800 MG tablet Commonly known as: SKELAXIN Take 1 tablet by mouth as needed  for muscle spasms.   MULTIVITAMIN ADULT PO Take by mouth daily.   NONFORMULARY OR COMPOUNDED ITEM at bedtime. Bi-Est 80/20 cream '1mg'$ /159m combined with Progesterone 1430m'30mg'm$   Apply 6 nights per week   NONFORMULARY OR COMPOUNDED ITEM Take 1 tablet by mouth daily. Liothyronine SR 59m34m (compond) Takes daily   NONFORMULARY OR COMPOUNDED ITEM daily. Testosterone cream (compound)   OMEGA-3 COMPLEX PO Take by mouth daily.   OVER THE COUNTER MEDICATION daily. IODIZYME SUPPLEMENT   oxyCODONE 5 MG immediate release tablet Commonly known as: Oxy IR/ROXICODONE Take 1 tablet (5 mg total) by mouth every 4 (four) hours as needed for severe pain.    polyethylene glycol powder 17 GM/SCOOP powder Commonly known as: GLYCOLAX/MIRALAX Take 17 g by mouth daily. Drink 17g (1 scoop) dissolved in water per day.   PREGNENOLONE PO Take by mouth daily.   progesterone 100 MG capsule Commonly known as: PROMETRIUM Take 100 mg by mouth daily.   QUERCETIN PO Take by mouth daily.   Testosterone 20 % Crea by Does not apply route.   TYROSINE PO Take by mouth daily. W/  IODINE   UBIQUINOL LIPOSOMAL PO Take by mouth daily.   ULTRAFLORA IMMUNE HEALTH PO Take by mouth daily.   VITAMIN D-3 PO Take by mouth daily.   ZINC PO Take by mouth daily.         Signed: MicJaquita Folds/16/2023, 7:17 AM

## 2022-03-18 ENCOUNTER — Encounter (HOSPITAL_BASED_OUTPATIENT_CLINIC_OR_DEPARTMENT_OTHER): Payer: Self-pay | Admitting: Obstetrics and Gynecology

## 2022-03-18 ENCOUNTER — Telehealth: Payer: Self-pay | Admitting: *Deleted

## 2022-03-18 LAB — SURGICAL PATHOLOGY

## 2022-03-18 NOTE — Telephone Encounter (Signed)
Post- Op Call  Valerie Vasquez underwent Trachelectomy, midurethral sling (advantage fit), robotic assisted sacrocolpopexy [name]), cystoscopy on Wednesday Nov 15th with Dr Wannetta Sender. The patient reports that her pain is controlled. She reports very minimal vaginal bleeding on her pad. She has not had a bowel movement and is taking Mirilax only for a bowel regimen.  Patient states has thrown up twice today.  First time threw up food that she ate last night and the second time it was minimal.  Pt states is not nauseated and does not have a fever.  Does not feel sick and the times she threw up came out of no where.  She has not eaten much today only an orange and some plain chips.  Pt is able to keep fluids down.   I spoke to Dr Wannetta Sender, she advised Lander as directed on the bottle as well as a clear diet (soups and clear liquids) for the next 24 hours.   Pt informed.  Ambrose Finland, CMA

## 2022-03-22 ENCOUNTER — Encounter: Payer: Self-pay | Admitting: *Deleted

## 2022-04-27 ENCOUNTER — Ambulatory Visit (INDEPENDENT_AMBULATORY_CARE_PROVIDER_SITE_OTHER): Payer: BC Managed Care – PPO | Admitting: Obstetrics and Gynecology

## 2022-04-27 ENCOUNTER — Encounter: Payer: Self-pay | Admitting: Obstetrics and Gynecology

## 2022-04-27 VITALS — BP 111/63 | HR 100

## 2022-04-27 DIAGNOSIS — Z9889 Other specified postprocedural states: Secondary | ICD-10-CM

## 2022-04-27 NOTE — Progress Notes (Signed)
[facility]  Date of Visit: 04/27/2022  History of Present Illness: Valerie Vasquez is a 61 y.o. female scheduled today for a post-operative visit.   Surgery: s/p Trachelectomy, midurethral sling (advantage fit), robotic assisted sacrocolpopexy [name]), cystoscopy on 03/16/22  She passed her postoperative void trial.   Postoperative course has been uncomplicated.   Today she reports that about a few weeks ago she had some painful pressure at the vagina, especially with moving around or in the car. Sometimes harder to sit longer periods. This has been improving gradually.   UTI in the last 6 weeks? No  Pain? Yes - taking advil or tylenol She has returned to her normal activity (except for postop restrictions) Vaginal bulge? No  Stress incontinence: No  Urgency/frequency: No  Urge incontinence: No  Voiding dysfunction: No  Bowel issues: Yes - still has loose bowels due to IBS, was worse with the miralax. Feels that she has to wipe a lot to get clean.   Subjective Success: Do you usually have a bulge or something falling out that you can see or feel in the vaginal area? No  Retreatment Success: Any retreatment with surgery or pessary for any compartment? No   Pathology results: CERVIX, RESECTION:  Benign leiomyoma measuring 1 cm in greatest dimension  Mild chronic and follicular cervicitis with squamous metaplasia showing  hyperkeratosis  Negative for dysplasia and diagnostic viral change    Medications: She has a current medication list which includes the following prescription(s): acetaminophen, b complex plus, cholecalciferol, dha-epa-vitamin e, duloxetine, estradiol, malic acid, metaxalone, misc natural products, multiple vitamin, multiple vitamins-minerals, NONFORMULARY OR COMPOUNDED ITEM, NONFORMULARY OR COMPOUNDED ITEM, NONFORMULARY OR COMPOUNDED ITEM, OVER THE COUNTER MEDICATION, prasterone (dhea), pregnenolone micronized, probiotic product, progesterone,  quercetin, saccharomyces boulardii, brain might/dha & co q10, testosterone, thyroid, tyrosine, ubiquinol liposomal, and vitamin k.   Allergies: Patient is allergic to codeine, diazepam, gluten meal, morphine, and topiramate.   Physical Exam: BP 111/63   Pulse 100   Abdomen: soft, non-tender, without masses or organomegaly Laparoscopic Incisions: healing well. Small suture present on left side incision, easily removed with forceps. Suprapubic incisions well healed.  Pelvic Examination: Vagina: Incisions healing well. Sutures are present at the cuff and there is not granulation tissue. No tenderness along the anterior or posterior vagina. No apical tenderness. No pelvic masses. No visible or palpable mesh.  Pelvic floor muscles tender to palpation bilaterally and pain is reproduced.   POP-Q: POP-Q  -3                                            Aa   -3                                           Ba  -6.5                                              C   3.5  Gh  3.5                                            Pb  6.5                                            tvl   -3                                            Ap  -3                                            Bp                                                 D     ---------------------------------------------------------  Assessment and Plan:  1. Post-operative state     - Pathology results were reviewed with the patient today and she verbalized understanding that the results were benign.  - Can resume regular activity including exercise gradually. She has a muscle relaxant and will take that as needed for pain. If pain does not improve in 1-2 months, she will consider pelvic PT.  - Wait an additional 6 weeks for intercourse.  - For loose stools, recommended stating metamucil daily for stool bulking. Gradually increase to 1 tbsp per day.  - Discussed avoidance of heavy lifting and  straining long term to reduce the risk of recurrence.   Follow up as needed   Jaquita Folds, MD

## 2022-06-07 ENCOUNTER — Ambulatory Visit (HOSPITAL_COMMUNITY)
Admission: RE | Admit: 2022-06-07 | Discharge: 2022-06-07 | Disposition: A | Discharge status: Home / Self Care | Payer: BC Managed Care – PPO | Source: Ambulatory Visit | Attending: Internal Medicine | Admitting: Internal Medicine

## 2022-06-07 ENCOUNTER — Other Ambulatory Visit: Payer: Self-pay | Admitting: Internal Medicine

## 2022-06-07 DIAGNOSIS — R609 Edema, unspecified: Secondary | ICD-10-CM | POA: Diagnosis not present

## 2022-06-07 DIAGNOSIS — M79604 Pain in right leg: Secondary | ICD-10-CM

## 2022-10-13 ENCOUNTER — Other Ambulatory Visit (HOSPITAL_COMMUNITY): Payer: Self-pay | Admitting: Internal Medicine

## 2022-10-13 DIAGNOSIS — M810 Age-related osteoporosis without current pathological fracture: Secondary | ICD-10-CM

## 2022-10-20 ENCOUNTER — Ambulatory Visit (HOSPITAL_COMMUNITY)
Admission: RE | Admit: 2022-10-20 | Discharge: 2022-10-20 | Disposition: A | Discharge status: Home / Self Care | Payer: BC Managed Care – PPO | Source: Ambulatory Visit | Attending: Internal Medicine | Admitting: Internal Medicine

## 2022-10-20 DIAGNOSIS — M810 Age-related osteoporosis without current pathological fracture: Secondary | ICD-10-CM | POA: Insufficient documentation

## 2022-11-25 ENCOUNTER — Encounter: Payer: Self-pay | Admitting: Obstetrics and Gynecology

## 2022-12-01 ENCOUNTER — Encounter: Payer: Self-pay | Admitting: Obstetrics and Gynecology

## 2022-12-16 NOTE — Progress Notes (Signed)
62 y.o. G17P2002 Married Caucasian female here for annual exam.    Had a sacrocolpopexy, trachelectomy, and midurthral sling with Dr. Florian Buff.   Normal sexual functioning.   Going to [facility] for hormone management.  She is taking estradiol, prometrium, and testosterone therapy.  Her estradiol was increased from 0.025 to 0.0375 mg twice weekly, which was changed 4 weeks ago.  Also on adrenal support and thyroid medication.  Still has sweating.   Trying to wean off Cymbalta on her own.    Attempting weight loss.  Seeing a trainer and saw a nutritionist.   Episode of slurred speech and difficulty walking.  Patient fell and she bruised her right hip.  She passed out.  She did not see a medical provider when this happened.  PCP:  Mitzi Hansen, MD  No LMP recorded. Patient has had a hysterectomy.           Sexually active: Yes.    The current method of family planning is status post hysterectomy.    Exercising: Yes.     Rowing, walking  Smoker:  no  Health Maintenance: Pap:    11-07-19 Neg:Neg HR HPV  History of abnormal Pap:  no MMG:  11/28/22 Breast Density Cat A, BI-RADS CAT 1 neg Colonoscopy:  cologuard 09/2021 neg BMD:   10/20/22  Result osteopenic TDaP:  PCP Gardasil:   no HIV: neg in past Hep C: neg in past Screening Labs:  Hb today: if needed , Urine today: no   reports that she has never smoked. She has never used smokeless tobacco. She reports that she does not currently use alcohol. She reports that she does not use drugs.  Past Medical History:  Diagnosis Date   Cervical stump prolapse    Chronic fatigue syndrome    Endometriosis    Fibromyalgia    History of chest pain    unspecified chest pain evaluated by cardiologist, dr m. branch note in epic 11-25-2021  no further work-up needed   History of uterine fibroid    Hyperlipidemia    Hypothyroidism    followed by [facility]   IBS (irritable bowel syndrome)    PONV (postoperative  nausea and vomiting)    Pre-diabetes    Prolapse of anterior vaginal wall    SUI (stress urinary incontinence, female)    Wears glasses     Past Surgical History:  Procedure Laterality Date   APPENDECTOMY  2007   at same time of D & C    BLADDER SUSPENSION N/A 03/16/2022   Procedure: TRANSVAGINAL TAPE (TVT) PROCEDURE;  Surgeon: Marguerita Beards, MD;  Location: [facility];  Service: Gynecology;  Laterality: N/A;   BREAST REDUCTION SURGERY Bilateral 06/2012   CYSTOSCOPY N/A 03/16/2022   Procedure: CYSTOSCOPY;  Surgeon: Marguerita Beards, MD;  Location: [facility];  Service: Gynecology;  Laterality: N/A;   DILATATION & CURETTAGE/HYSTEROSCOPY WITH MYOSURE  08/17/2007   in [place];  myomectomy   HYSTEROSCOPY WITH NOVASURE  07/29/2005   in [place];  NOVASURE ENDOMETRIAL ABLATION AND   LAPAROSCOPIC LEFT OVARIN CYSTECTOMY   LAPAROSCOPIC CHOLECYSTECTOMY  1994   LAPAROSCOPIC SUPRACERVICAL HYSTERECTOMY Bilateral 12/21/2007   in [place];   W/  BILATERAL SALPINOOPHORECTOMY   LAPAROSCOPIC TUBAL LIGATION Bilateral 1990   in [place]   PELVIC LAPAROSCOPY  01/15/2003   in [place];   LAPAROSCOPIC LEFT SALPINGECTOMY/  RIGHT PARTIAL SALPINGECOTMY/  LYSIS ADHESIONS/  AND D&C HYSTEROSCOPY   ROBOTIC ASSISTED LAPAROSCOPIC SACROCOLPOPEXY  N/A 03/16/2022   Procedure: XI ROBOTIC ASSISTED LAPAROSCOPIC SACROCOLPOPEXY;  Surgeon: Marguerita Beards, MD;  Location: [facility];  Service: Gynecology;  Laterality: N/A;   TRACHELECTOMY N/A 03/16/2022   Procedure: TRACHELECTOMY;  Surgeon: Marguerita Beards, MD;  Location: [facility];  Service: Gynecology;  Laterality: N/A;  total time requested is 3.5 hours    Current Outpatient Medications  Medication Sig Dispense Refill   acetaminophen (TYLENOL) 500 MG tablet Take 1 tablet (500 mg total) by mouth every 6 (six) hours as needed (pain). 30 tablet 0   B Complex-Folic Acid (B COMPLEX  PLUS) TABS Take by mouth daily.     Cholecalciferol (VITAMIN D-3 PO) Take by mouth daily.     DHA-EPA-Vitamin E (OMEGA-3 COMPLEX PO) Take by mouth daily.     DULoxetine (CYMBALTA) 30 MG capsule Take 30 mg by mouth daily.     estradiol (VIVELLE-DOT) 0.0375 MG/24HR Place 1 patch onto the skin 2 (two) times a week.     MALIC ACID PO Take by mouth daily. W/  MAGNESIUM     metaxalone (SKELAXIN) 800 MG tablet Take 1 tablet by mouth as needed for muscle spasms.     Misc Natural Products (ADRENAL PO) Take by mouth daily. Adrenal support     Multiple Vitamin (MULTIVITAMIN ADULT PO) Take by mouth daily.     Multiple Vitamins-Minerals (ZINC PO) Take by mouth daily.     NONFORMULARY OR COMPOUNDED ITEM daily. Testosterone cream (compound)     OVER THE COUNTER MEDICATION daily. IODIZYME SUPPLEMENT     Prasterone, DHEA, (DHEA ADVANCED FORMULA PO) Take by mouth daily.     Probiotic Product (ULTRAFLORA IMMUNE HEALTH PO) Take by mouth daily.     progesterone (PROMETRIUM) 100 MG capsule Take 100 mg by mouth daily.     QUERCETIN PO Take by mouth daily.     Saccharomyces boulardii (DAILY PROBIOTIC SUPPLEMENT PO) Take by mouth daily.     Specialty Vitamins Products (BRAIN MIGHT/DHA & CO Q10) TABS Take by mouth daily at 12 noon. BRAIN SUPPORT     thyroid (ARMOUR THYROID) 30 MG tablet Take 60 mg by mouth daily before breakfast.     TYROSINE PO Take by mouth daily. W/  IODINE     UBIQUINOL LIPOSOMAL PO Take by mouth daily.     vitamin k 100 MCG tablet Take 100 mcg by mouth daily.     No current facility-administered medications for this visit.    Family History  Problem Relation Age of Onset   Hypertension Mother    Atrial fibrillation Mother    Parkinson's disease Father    Hypertension Brother    Stroke Maternal Grandmother    Diabetes Paternal Grandmother     Review of Systems  All other systems reviewed and are negative.   Exam:   BP 110/70   Pulse 77   Ht 5' 1.81" (1.57 m)   Wt 185 lb (83.9  kg)   SpO2 100%   BMI 34.04 kg/m     General appearance: alert, cooperative and appears stated age Head: normocephalic, without obvious abnormality, atraumatic Neck: no adenopathy, supple, symmetrical, trachea midline and thyroid normal to inspection and palpation Lungs: clear to auscultation bilaterally Breasts: normal appearance, no masses or tenderness, No nipple retraction or dimpling, No nipple discharge or bleeding, No axillary adenopathy Heart: regular rate and rhythm Abdomen: soft, non-tender; no masses, no organomegaly Extremities: extremities normal, atraumatic, no cyanosis or edema Skin: skin color, texture, turgor  normal. No rashes or lesions Lymph nodes: cervical, supraclavicular, and axillary nodes normal. Neurologic: grossly normal  Pelvic: External genitalia:  no lesions              No abnormal inguinal nodes palpated.              Urethra:  normal appearing urethra with no masses, tenderness or lesions              Bartholins and Skenes: normal                 Vagina: normal appearing vagina with normal color and discharge, no lesions.  Good support.               Cervix absent              Pap taken: no Bimanual Exam:  Uterus: absent              Adnexa: no mass, fullness, tenderness              Rectal exam: yes.  Confirms.              Anus:  normal sphincter tone, no lesions  Chaperone was present for exam:  Warren Lacy, CMA  Assessment:   Well woman visit with gynecologic exam. Status post supracervical hysterectomy/BSO. Status post robotic sacrocolpopexy and trachelectomy with midurethral sling. Final pathology - benign leiomyoma of cervix. Negative for dysplasia. Use of compounded HRT and testosterone through [facility].  Status post bowel resection. Remote hx of abnormal paps and cryotherapy. Possible TIA event.   Plan: Mammogram screening discussed. Self breast awareness reviewed. Pap and HR HPV not indicated.  Guidelines for Calcium, Vitamin D,  regular exercise program including cardiovascular and weight bearing exercise. Discused WHI and use of HRT which can increase risk of PE, DVT, MI, stroke and breast cancer.  I have concerns that she may have had a TIA, and I recommend she follow up with her PCP.  I indicated she may need to stop her HRT and testosterone therapy.  She expresses appreciation for our consultation. She will also address her desire to stop her Cymbalta with her PCP.   Follow up annually and prn.

## 2022-12-26 ENCOUNTER — Encounter: Payer: Self-pay | Admitting: Obstetrics and Gynecology

## 2022-12-26 ENCOUNTER — Ambulatory Visit (INDEPENDENT_AMBULATORY_CARE_PROVIDER_SITE_OTHER): Payer: BC Managed Care – PPO | Admitting: Obstetrics and Gynecology

## 2022-12-26 VITALS — BP 110/70 | HR 77 | Ht 61.81 in | Wt 185.0 lb

## 2022-12-26 DIAGNOSIS — Z79890 Hormone replacement therapy (postmenopausal): Secondary | ICD-10-CM | POA: Diagnosis not present

## 2022-12-26 DIAGNOSIS — Z01419 Encounter for gynecological examination (general) (routine) without abnormal findings: Secondary | ICD-10-CM | POA: Diagnosis not present

## 2022-12-26 NOTE — Patient Instructions (Signed)
Transient Ischemic Attack A transient ischemic attack (TIA) happens when blood supply to the brain is blocked temporarily. A TIA causes stroke-like symptoms that go away quickly without causing any permanent damage. Having a TIA can be considered a warning sign for a stroke and should not be ignored. A person who has a TIA is at higher risk for a stroke. What are the causes? This condition is caused by a temporary blockage in an artery in the head or neck. This means the brain does not get the blood supply it needs. A blockage can be caused by: Fatty buildup in an artery in the head or neck (atherosclerosis). A blood clot traveling from the heart. An artery tear (dissection). Inflammation of an artery (vasculitis). Sometimes the cause is not known. What increases the risk? Certain factors make you more likely to develop this condition. Some of these are things you can change, including: Using products that contain nicotine or tobacco. Being inactive. Heavy alcohol use. Drug use, especially cocaine and methamphetamine. Medical conditions that may increase your risk include: High blood pressure (hypertension). High cholesterol. Diabetes. Heart disease (coronary artery disease). An irregular heartbeat, also called atrial fibrillation (AFib). Sickle cell disease. Blood clotting disorders (hypercoagulable state). Other risk factors include: Being over the age of 62. Being female. Obesity. Sleep problems such as sleep apnea. Family history of stroke. Previous history of blood clots, stroke, TIA, or heart attack. What are the signs or symptoms? Symptoms of a TIA are the same as those of a stroke. The symptoms develop suddenly, and then go away quickly. They may include: Dizziness, loss of balance and coordination, or trouble walking. Vision changes, such as double vision, blurred vision, or loss of vision. Weakness or numbness in your face, arm, or leg, especially on one side of your  body. Trouble speaking, understanding speech, or both (aphasia). Nausea and vomiting. Severe headache. Confusion. If possible, note what time your symptoms started. Tell your health care provider. How is this diagnosed? This condition may be diagnosed based on: Your symptoms and medical history. A physical exam. Imaging tests, usually a CT scan or MRI of the brain. Blood tests. You may also have other tests, including: Electrocardiogram (ECG). Echocardiogram. Continuous heart monitoring. Carotid ultrasound. A scan of blood circulation in the brain (CT angiogram or MR angiogram). How is this treated? The goal of treatment is to reduce the risk for a stroke. Stroke prevention therapies may include: Changes to diet and lifestyle, such as being physically active and stopping smoking. Treating other health conditions, such as diabetes or AFib. Medicines to thin the blood (antiplatelets or anticoagulants). Blood pressure medicines. Medicines to reduce cholesterol. If testing shows a narrowing in the arteries to your brain, your health care provider may recommend a procedure, such as: Carotid endarterectomy. This is done to remove the blockage from your artery. Carotid angioplasty and stenting. This uses a small mesh tube (stent) to open or widen an artery in the neck. The stent helps keep the artery open by supporting the artery walls. Follow these instructions at home: Medicines Take over-the-counter and prescription medicines only as told by your health care provider. If you were told to take a medicine to thin your blood, such as aspirin or an anticoagulant, use it exactly as told by your health care provider. Taking too much blood-thinning medicine can cause bleeding. Taking too little will not protect you against a stroke and other problems. Eating and drinking  Eat 5 or more servings of fruits and  vegetables each day. Follow guidelines from your health care provider about your  diet. You may need to follow a certain diet to help manage risk factors for stroke. This may include: Eating a low-fat, low-salt diet. Choosing high-fiber foods. Limiting carbohydrates and sugar. If you drink alcohol: Limit how much you have to: 0-1 drink a day for women who are not pregnant. 0-2 drinks a day for men. Know how much alcohol is in your drink. In the [place] one drink equals one 12 oz bottle of beer (355 mL), one 5 oz glass of wine (148 mL), or one 1 oz glass of hard liquor (44 mL). General instructions Maintain a healthy weight. Try to get at least 30 minutes of exercise on most days. Get treatment if you have sleep apnea. Do not use any products that contain nicotine or tobacco. These products include cigarettes, chewing tobacco, and vaping devices, such as e-cigarettes. If you need help quitting, ask your health care provider. Do not use illegal drugs. Keep all follow-up visits. Your health care provider will want to know if you have any more symptoms and to check blood labs if any medicines were prescribed. Where to find more information American Stroke Association: stroke.org Get help right away if: You have chest pain. You have fast or irregular heartbeats (palpitations). You have any symptoms of a stroke. "BE FAST" is an easy way to remember the main warning signs of a stroke. B - Balance. Signs are dizziness, sudden trouble walking, or loss of balance. E - Eyes. Signs are trouble seeing or a sudden change in vision. F - Face. Signs are sudden weakness or numbness of the face, or the face or eyelid drooping on one side. A - Arms. Signs are weakness or numbness in an arm. This happens suddenly and usually on one side of the body. S - Speech.Signs are sudden trouble speaking, slurred speech, or trouble understanding what people say. T - Time. Time to call emergency services. Write down what time symptoms started. You have other signs of a stroke, such as: A sudden,  severe headache with no known cause. Nausea or vomiting. Seizure. These symptoms may be an emergency. Get help right away. Call 911. Do not wait to see if the symptoms will go away. Do not drive yourself to the hospital. This information is not intended to replace advice given to you by your health care provider. Make sure you discuss any questions you have with your health care provider. Document Revised: 10/01/2021 Document Reviewed: 10/01/2021 Elsevier Patient Education  2024 Elsevier Inc.   EXERCISE AND DIET:  We recommended that you start or continue a regular exercise program for good health. Regular exercise means any activity that makes your heart beat faster and makes you sweat.  We recommend exercising at least 30 minutes per day at least 3 days a week, preferably 4 or 5.  We also recommend a diet low in fat and sugar.  Inactivity, poor dietary choices and obesity can cause diabetes, heart attack, stroke, and kidney damage, among others.    ALCOHOL AND SMOKING:  Women should limit their alcohol intake to no more than 7 drinks/beers/glasses of wine (combined, not each!) per week. Moderation of alcohol intake to this level decreases your risk of breast cancer and liver damage. And of course, no recreational drugs are part of a healthy lifestyle.  And absolutely no smoking or even second hand smoke. Most people know smoking can cause heart and lung diseases, but did you  know it also contributes to weakening of your bones? Aging of your skin?  Yellowing of your teeth and nails?  CALCIUM AND VITAMIN D:  Adequate intake of calcium and Vitamin D are recommended.  The recommendations for exact amounts of these supplements seem to change often, but generally speaking 600 mg of calcium (either carbonate or citrate) and 800 units of Vitamin D per day seems prudent. Certain women may benefit from higher intake of Vitamin D.  If you are among these women, your doctor will have told you during your  visit.    PAP SMEARS:  Pap smears, to check for cervical cancer or precancers,  have traditionally been done yearly, although recent scientific advances have shown that most women can have pap smears less often.  However, every woman still should have a physical exam from her gynecologist every year. It will include a breast check, inspection of the vulva and vagina to check for abnormal growths or skin changes, a visual exam of the cervix, and then an exam to evaluate the size and shape of the uterus and ovaries.  And after 61 years of age, a rectal exam is indicated to check for rectal cancers. We will also provide age appropriate advice regarding health maintenance, like when you should have certain vaccines, screening for sexually transmitted diseases, bone density testing, colonoscopy, mammograms, etc.   MAMMOGRAMS:  All women over 23 years old should have a yearly mammogram. Many facilities now offer a "3D" mammogram, which may cost around $50 extra out of pocket. If possible,  we recommend you accept the option to have the 3D mammogram performed.  It both reduces the number of women who will be called back for extra views which then turn out to be normal, and it is better than the routine mammogram at detecting truly abnormal areas.    COLONOSCOPY:  Colonoscopy to screen for colon cancer is recommended for all women at age 48.  We know, you hate the idea of the prep.  We agree, BUT, having colon cancer and not knowing it is worse!!  Colon cancer so often starts as a polyp that can be seen and removed at colonscopy, which can quite literally save your life!  And if your first colonoscopy is normal and you have no family history of colon cancer, most women don't have to have it again for 10 years.  Once every ten years, you can do something that may end up saving your life, right?  We will be happy to help you get it scheduled when you are ready.  Be sure to check your insurance coverage so you understand  how much it will cost.  It may be covered as a preventative service at no cost, but you should check your particular policy.

## 2023-01-18 ENCOUNTER — Telehealth: Payer: Self-pay | Admitting: Internal Medicine

## 2023-01-18 NOTE — Telephone Encounter (Signed)
Patient is requesting to switch from Dr. Carolan Clines to Dr. Dina Rich.  Please advise, Thank you

## 2023-02-03 ENCOUNTER — Ambulatory Visit: Payer: BC Managed Care – PPO

## 2023-02-03 ENCOUNTER — Ambulatory Visit: Payer: BC Managed Care – PPO | Attending: Cardiology | Admitting: Cardiology

## 2023-02-03 ENCOUNTER — Encounter: Payer: Self-pay | Admitting: Cardiology

## 2023-02-03 VITALS — BP 138/92 | HR 76 | Ht 61.0 in | Wt 189.0 lb

## 2023-02-03 DIAGNOSIS — R0789 Other chest pain: Secondary | ICD-10-CM | POA: Diagnosis not present

## 2023-02-03 DIAGNOSIS — R55 Syncope and collapse: Secondary | ICD-10-CM

## 2023-02-03 NOTE — Progress Notes (Signed)
Clinical Summary Valerie Vasquez is a 62 y.o.female last seen by Dr Valerie Vasquez in 10/2021, this is our first visit together. Seen for the following medical problems.   1.Chest pain - seen by Dr Valerie Vasquez 10/2021 for chest pain - described at that time as a spasm like feeling, can be sharp/stabbing. Improves with prn muscle relaxer. Can have some associated SOB.  - chest pain throught atypical, SOB thought most likely related to deconditioning and weight gain.    2. HLD - 10/2022 TC 162 HDL 46 TG 172 LDL 81 (off meds)  - did tolerate statins, tried 3 different ones - reports pcp was going to start [id number] but insurance did not cover.    3.?TIA/syncope 01/2023: minimal plaque - has neuro appt 03/2023 - pcp started asa 81mg  daily  - episode late June. Was sitting in living room and talking with husband. Trouble finding words, felt lightheaded. Felt weak all over, trouble walking. Episode lasted 20-30 mintes. Got up out of bed, fell to floor. Does not remember past that.  - next day felt fine without any issues - since that episode no significant recurrences, but can feel weak at times - occasonal palpitations that are random     4. History of fibromyalgia  SH;: [name] is also a patient Past Medical History:  Diagnosis Date   Cervical stump prolapse    Chronic fatigue syndrome    Endometriosis    Fibromyalgia    History of chest pain    unspecified chest pain evaluated by cardiologist, dr Valerie Vasquez note in epic 11-25-2021  no further work-up needed   History of uterine fibroid    Hyperlipidemia    Hypothyroidism    followed by Valerie Vasquez   IBS (irritable bowel syndrome)    PONV (postoperative nausea and vomiting)    Pre-diabetes    Prolapse of anterior vaginal wall    SUI (stress urinary incontinence, female)    Wears glasses      Allergies  Allergen Reactions   Codeine     High doses cause severe chest tightness    Diazepam      High doses cause sever chest tightness   Gluten Meal     Gluten free diet   Morphine     High doses cause severe chest tightness   Topiramate Other (See Comments)    Per pt suicidal     Current Outpatient Medications  Medication Sig Dispense Refill   acetaminophen (TYLENOL) 500 MG tablet Take 1 tablet (500 mg total) by mouth every 6 (six) hours as needed (pain). 30 tablet 0   B Complex-Folic Acid (B COMPLEX PLUS) TABS Take by mouth daily.     Cholecalciferol (VITAMIN D-3 PO) Take by mouth daily.     DHA-EPA-Vitamin E (OMEGA-3 COMPLEX PO) Take by mouth daily.     DULoxetine (CYMBALTA) 30 MG capsule Take 30 mg by mouth daily.     estradiol (VIVELLE-DOT) 0.0375 MG/24HR Place 1 patch onto the skin 2 (two) times a week.     MALIC ACID PO Take by mouth daily. W/  MAGNESIUM     metaxalone (SKELAXIN) 800 MG tablet Take 1 tablet by mouth as needed for muscle spasms.     Misc Natural Products (ADRENAL PO) Take by mouth daily. Adrenal support     Multiple Vitamin (MULTIVITAMIN ADULT PO) Take by mouth daily.     Multiple Vitamins-Minerals (ZINC PO) Take by mouth daily.  NONFORMULARY OR COMPOUNDED ITEM daily. Testosterone cream (compound)     OVER THE COUNTER MEDICATION daily. IODIZYME SUPPLEMENT     Prasterone, DHEA, (DHEA ADVANCED FORMULA PO) Take by mouth daily.     Probiotic Product (ULTRAFLORA IMMUNE HEALTH PO) Take by mouth daily.     progesterone (PROMETRIUM) 100 MG capsule Take 100 mg by mouth daily.     QUERCETIN PO Take by mouth daily.     Saccharomyces boulardii (DAILY PROBIOTIC SUPPLEMENT PO) Take by mouth daily.     Specialty Vitamins Products (BRAIN MIGHT/DHA & CO Q10) TABS Take by mouth daily at 12 noon. BRAIN SUPPORT     thyroid (ARMOUR THYROID) 30 MG tablet Take 60 mg by mouth daily before breakfast.     TYROSINE PO Take by mouth daily. W/  IODINE     UBIQUINOL LIPOSOMAL PO Take by mouth daily.     vitamin k 100 MCG tablet Take 100 mcg by mouth daily.     No current  facility-administered medications for this visit.     Past Surgical History:  Procedure Laterality Date   APPENDECTOMY  2007   at same time of D & C    BLADDER SUSPENSION N/A 03/16/2022   Procedure: TRANSVAGINAL TAPE (TVT) PROCEDURE;  Surgeon: Valerie Beards, MD;  [facility];  Service: Gynecology;  Laterality: N/A;   BREAST REDUCTION SURGERY Bilateral 06/2012   CYSTOSCOPY N/A 03/16/2022   Procedure: CYSTOSCOPY;  Surgeon: Valerie Beards, MD;  Location: [facility];  Service: Gynecology;  Laterality: N/A;   DILATATION & CURETTAGE/HYSTEROSCOPY WITH MYOSURE  08/17/2007   in [place];  myomectomy   HYSTEROSCOPY WITH NOVASURE  07/29/2005   in [place];  NOVASURE ENDOMETRIAL ABLATION AND   LAPAROSCOPIC LEFT OVARIN CYSTECTOMY   LAPAROSCOPIC CHOLECYSTECTOMY  1994   LAPAROSCOPIC SUPRACERVICAL HYSTERECTOMY Bilateral 12/21/2007   in [place];   W/  BILATERAL SALPINOOPHORECTOMY   LAPAROSCOPIC TUBAL LIGATION Bilateral 1990   in [place]   PELVIC LAPAROSCOPY  01/15/2003   in [place];   LAPAROSCOPIC LEFT SALPINGECTOMY/  RIGHT PARTIAL SALPINGECOTMY/  LYSIS ADHESIONS/  AND D&C HYSTEROSCOPY   ROBOTIC ASSISTED LAPAROSCOPIC SACROCOLPOPEXY N/A 03/16/2022   Procedure: XI ROBOTIC ASSISTED LAPAROSCOPIC SACROCOLPOPEXY;  Surgeon: Valerie Beards, MD;  Location: [facility];  Service: Gynecology;  Laterality: N/A;   TRACHELECTOMY N/A 03/16/2022   Procedure: TRACHELECTOMY;  Surgeon: Valerie Beards, MD;  Location: [facility];  Service: Gynecology;  Laterality: N/A;  total time requested is 3.5 hours     Allergies  Allergen Reactions   Codeine     High doses cause severe chest tightness    Diazepam     High doses cause sever chest tightness   Gluten Meal     Gluten free diet   Morphine     High doses cause severe chest tightness   Topiramate Other (See Comments)    Per pt suicidal      Family  History  Problem Relation Age of Onset   Hypertension Mother    Atrial fibrillation Mother    Parkinson's disease Father    Hypertension Brother    Stroke Maternal Grandmother    Diabetes Paternal Grandmother      Social History Valerie Vasquez reports that she has never smoked. She has never used smokeless tobacco. Valerie Vasquez reports that she does not currently use alcohol.   Review of Systems CONSTITUTIONAL: No weight loss, fever, chills, weakness or fatigue.  HEENT: Eyes: No visual loss, blurred vision, double vision or yellow sclerae.No hearing loss, sneezing, congestion, runny nose or sore throat.  SKIN: No rash or itching.  CARDIOVASCULAR: per hpi RESPIRATORY: No shortness of breath, cough or sputum.  GASTROINTESTINAL: No anorexia, nausea, vomiting or diarrhea. No abdominal pain or blood.  GENITOURINARY: No burning on urination, no polyuria NEUROLOGICAL:per hpi MUSCULOSKELETAL: No muscle, back pain, joint pain or stiffness.  LYMPHATICS: No enlarged nodes. No history of splenectomy.  PSYCHIATRIC: No history of depression or anxiety.  ENDOCRINOLOGIC: No reports of sweating, cold or heat intolerance. No polyuria or polydipsia.  Marland Kitchen   Physical Examination Today's Vitals   02/03/23 1013 02/03/23 1015  BP: (!) 140/90 (!) 138/92  Pulse: 76   SpO2: 95%   Weight: 189 lb (85.7 kg)   Height: 5\' 1"  (1.549 m)    Body mass index is 35.71 kg/m.  Gen: resting comfortably, no acute distress HEENT: no scleral icterus, pupils equal round and reactive, no palptable cervical adenopathy,  CV: RRR, no m/rg, no jvd Resp: Clear to auscultation bilaterally GI: abdomen is soft, non-tender, non-distended, normal bowel sounds, no hepatosplenomegaly MSK: extremities are warm, no edema.  Skin: warm, no rash Neuro:  no focal deficits Psych: appropriate affect     Assessment and Plan  1.Syncope/Possible TIA - episode as described above, unclear etiology. Sees neurology next month - from  cardiac standpoint benign carotid dopplers. We will plan for a 14 day zio patch in setting of syncope and possible TIA, plan for echo as well - EKG today shows NSR  F/u pending results.       Antoine Poche, ValerieD.,

## 2023-02-03 NOTE — Patient Instructions (Signed)
Medication Instructions:  Your physician recommends that you continue on your current medications as directed. Please refer to the Current Medication list given to you today.  *If you need a refill on your cardiac medications before your next appointment, please call your pharmacy*   Lab Work: None If you have labs (blood work) drawn today and your tests are completely normal, you will receive your results only by: MyChart Message (if you have MyChart) OR A paper copy in the mail If you have any lab test that is abnormal or we need to change your treatment, we will call you to review the results.   Testing/Procedures: Your physician has requested that you have an echocardiogram. Echocardiography is a painless test that uses sound waves to create images of your heart. It provides your doctor with information about the size and shape of your heart and how well your heart's chambers and valves are working. This procedure takes approximately one hour. There are no restrictions for this procedure. Please do NOT wear cologne, perfume, aftershave, or lotions (deodorant is allowed). Please arrive 15 minutes prior to your appointment time.    Follow-Up: At [facility], you and your health needs are our priority.  As part of our continuing mission to provide you with exceptional heart care, we have created designated Provider Care Teams.  These Care Teams include your primary Cardiologist (physician) and Advanced Practice Providers (APPs -  Physician Assistants and Nurse Practitioners) who all work together to provide you with the care you need, when you need it.  We recommend signing up for the patient portal called "MyChart".  Sign up information is provided on this After Visit Summary.  MyChart is used to connect with patients for Virtual Visits (Telemedicine).  Patients are able to view lab/test results, encounter notes, upcoming appointments, etc.  Non-urgent messages can be sent to your  provider as well.   To learn more about what you can do with MyChart, go to [email or web address].    Your next appointment:    Follow up is Pending testing results  Provider:   You may see Dina Rich, MD or one of the following Advanced Practice Providers on your designated Care Team:   Randall An, PA-C  Jacolyn Reedy, PA-C     Other Instructions Christena Deem- Long Term Monitor Instructions   Your physician has requested you wear your ZIO patch monitor____14___days.   This is a single patch monitor.  [employer] supplies one patch monitor per enrollment.  Additional stickers are not available.   Please do not apply patch if you will be having a Nuclear Stress Test, Echocardiogram, Cardiac CT, MRI, or Chest Xray during the time frame you would be wearing the monitor. The patch cannot be worn during these tests.  You cannot remove and re-apply the ZIO XT patch monitor.   Your ZIO patch monitor will be sent USPS Priority mail from [facility] directly to your home address. The monitor may also be mailed to a PO BOX if home delivery is not available.   It may take 3-5 days to receive your monitor after you have been enrolled.   Once you have received you monitor, please review enclosed instructions.  Your monitor has already been registered assigning a specific monitor serial # to you.   Applying the monitor   Shave hair from upper left chest.   Hold abrader disc by orange tab.  Rub abrader in 40 strokes over left upper chest as indicated in your monitor  instructions.   Clean area with 4 enclosed alcohol pads .  Use all pads to assure are is cleaned thoroughly.  Let dry.   Apply patch as indicated in monitor instructions.  Patch will be place under collarbone on left side of chest with arrow pointing upward.   Rub patch adhesive wings for 2 minutes.Remove white label marked "1".  Remove white label marked "2".  Rub patch adhesive wings for 2 additional minutes.   While  looking in a mirror, press and release button in center of patch.  A small green light will flash 3-4 times .  This will be your only indicator the monitor has been turned on.     Do not shower for the first 24 hours.  You may shower after the first 24 hours.   Press button if you feel a symptom. You will hear a small click.  Record Date, Time and Symptom in the Patient Log Book.   When you are ready to remove patch, follow instructions on last 2 pages of Patient Log Book.  Stick patch monitor onto last page of Patient Log Book.   Place Patient Log Book in [place] box.  Use locking tab on box and tape box closed securely.  The [employer] and [employer] has [employer] on it.  Please place in mailbox as soon as possible.  Your physician should have your test results approximately 7 days after the monitor has been mailed back to [facility].   Call [facility] Customer Care at [phone] if you have questions regarding your ZIO XT patch monitor.  Call them immediately if you see an orange light blinking on your monitor.   If your monitor falls off in less than 4 days contact our Monitor department at [phone].  If your monitor becomes loose or falls off after 4 days call [employer] at [phone] for suggestions on securing your monitor.

## 2023-02-08 ENCOUNTER — Observation Stay (HOSPITAL_COMMUNITY)
Admission: EM | Admit: 2023-02-08 | Discharge: 2023-02-09 | Disposition: A | Discharge status: Home / Self Care | Payer: BC Managed Care – PPO | Attending: Emergency Medicine | Admitting: Emergency Medicine

## 2023-02-08 ENCOUNTER — Encounter (HOSPITAL_COMMUNITY): Payer: Self-pay | Admitting: Emergency Medicine

## 2023-02-08 ENCOUNTER — Observation Stay (HOSPITAL_COMMUNITY): Payer: BC Managed Care – PPO

## 2023-02-08 ENCOUNTER — Emergency Department (HOSPITAL_COMMUNITY): Payer: BC Managed Care – PPO

## 2023-02-08 ENCOUNTER — Other Ambulatory Visit: Payer: Self-pay

## 2023-02-08 DIAGNOSIS — E669 Obesity, unspecified: Secondary | ICD-10-CM | POA: Insufficient documentation

## 2023-02-08 DIAGNOSIS — R0789 Other chest pain: Principal | ICD-10-CM

## 2023-02-08 DIAGNOSIS — K219 Gastro-esophageal reflux disease without esophagitis: Secondary | ICD-10-CM

## 2023-02-08 DIAGNOSIS — Z6834 Body mass index (BMI) 34.0-34.9, adult: Secondary | ICD-10-CM | POA: Insufficient documentation

## 2023-02-08 DIAGNOSIS — F419 Anxiety disorder, unspecified: Secondary | ICD-10-CM | POA: Insufficient documentation

## 2023-02-08 DIAGNOSIS — E039 Hypothyroidism, unspecified: Secondary | ICD-10-CM | POA: Insufficient documentation

## 2023-02-08 DIAGNOSIS — Z7982 Long term (current) use of aspirin: Secondary | ICD-10-CM | POA: Diagnosis not present

## 2023-02-08 DIAGNOSIS — Z79899 Other long term (current) drug therapy: Secondary | ICD-10-CM | POA: Insufficient documentation

## 2023-02-08 DIAGNOSIS — R519 Headache, unspecified: Secondary | ICD-10-CM | POA: Diagnosis not present

## 2023-02-08 DIAGNOSIS — G459 Transient cerebral ischemic attack, unspecified: Principal | ICD-10-CM | POA: Diagnosis present

## 2023-02-08 DIAGNOSIS — R079 Chest pain, unspecified: Secondary | ICD-10-CM | POA: Diagnosis not present

## 2023-02-08 LAB — BASIC METABOLIC PANEL
Anion gap: 9 (ref 5–15)
BUN: 15 mg/dL (ref 8–23)
CO2: 26 mmol/L (ref 22–32)
Calcium: 8.9 mg/dL (ref 8.9–10.3)
Chloride: 102 mmol/L (ref 98–111)
Creatinine, Ser: 0.74 mg/dL (ref 0.44–1.00)
GFR, Estimated: >60 mL/min (ref >60)
Glucose, Bld: 105 mg/dL — ABNORMAL HIGH (ref 70–99)
Potassium: 4.1 mmol/L (ref 3.5–5.1)
Sodium: 137 mmol/L (ref 135–145)

## 2023-02-08 LAB — CBC
HCT: 44.8 % (ref 36.0–46.0)
Hemoglobin: 14.8 g/dL (ref 12.0–15.0)
MCH: 29.5 pg (ref 26.0–34.0)
MCHC: 33 g/dL (ref 30.0–36.0)
MCV: 89.2 fL (ref 80.0–100.0)
Platelets: 275 10*3/uL (ref 150–400)
RBC: 5.02 MIL/uL (ref 3.87–5.11)
RDW: 12 % (ref 11.5–15.5)
WBC: 5.9 10*3/uL (ref 4.0–10.5)
nRBC: 0 % (ref 0.0–0.2)

## 2023-02-08 LAB — HEPATIC FUNCTION PANEL
ALT: 21 U/L (ref 0–44)
AST: 23 U/L (ref 15–41)
Albumin: 3.6 g/dL (ref 3.5–5.0)
Alkaline Phosphatase: 94 U/L (ref 38–126)
Bilirubin, Direct: <0.1 mg/dL (ref 0.0–0.2)
Total Bilirubin: 0.5 mg/dL (ref 0.3–1.2)
Total Protein: 6.7 g/dL (ref 6.5–8.1)

## 2023-02-08 LAB — TROPONIN I (HIGH SENSITIVITY)
Troponin I (High Sensitivity): 2 ng/L (ref <18)
Troponin I (High Sensitivity): <2 ng/L (ref <18)

## 2023-02-08 LAB — D-DIMER, QUANTITATIVE: D-Dimer, Quant: 0.47 ug{FEU}/mL (ref 0.00–0.50)

## 2023-02-08 LAB — LIPASE, BLOOD: Lipase: 29 U/L (ref 11–51)

## 2023-02-08 MED ORDER — ASPIRIN 81 MG PO TBEC
81.0000 mg | DELAYED_RELEASE_TABLET | Freq: Every day | ORAL | Status: DC
  Administered 2023-02-09: 81 mg via ORAL
  Filled 2023-02-08: qty 1

## 2023-02-08 MED ORDER — LORAZEPAM 2 MG/ML IJ SOLN: 0.5000 mg | Freq: Once | INTRAMUSCULAR | Status: DC

## 2023-02-08 MED ORDER — ACETAMINOPHEN 650 MG RE SUPP: 650.0000 mg | RECTAL | Status: DC | PRN

## 2023-02-08 MED ORDER — ACETAMINOPHEN 325 MG PO TABS
650.0000 mg | ORAL_TABLET | ORAL | Status: DC | PRN
  Administered 2023-02-09: 650 mg via ORAL
  Filled 2023-02-08: qty 2

## 2023-02-08 MED ORDER — GADOBUTROL 1 MMOL/ML IV SOLN
9.0000 mL | Freq: Once | INTRAVENOUS | Status: AC | PRN
  Administered 2023-02-08: 9 mL via INTRAVENOUS

## 2023-02-08 MED ORDER — SODIUM CHLORIDE 0.9 % IV SOLN: INTRAVENOUS | Status: DC

## 2023-02-08 MED ORDER — HEPARIN SODIUM (PORCINE) 5000 UNIT/ML IJ SOLN
5000.0000 [IU] | Freq: Three times a day (TID) | INTRAMUSCULAR | Status: DC
  Filled 2023-02-08 (×2): qty 1

## 2023-02-08 MED ORDER — METHOCARBAMOL 500 MG PO TABS
500.0000 mg | ORAL_TABLET | Freq: Once | ORAL | Status: AC
  Administered 2023-02-08: 500 mg via ORAL
  Filled 2023-02-08: qty 1

## 2023-02-08 MED ORDER — THYROID 30 MG PO TABS
60.0000 mg | ORAL_TABLET | Freq: Every day | ORAL | Status: DC
  Administered 2023-02-09: 60 mg via ORAL
  Filled 2023-02-08: qty 2

## 2023-02-08 MED ORDER — DIPHENHYDRAMINE HCL 50 MG/ML IJ SOLN
25.0000 mg | Freq: Once | INTRAMUSCULAR | Status: AC
  Administered 2023-02-08: 25 mg via INTRAVENOUS
  Filled 2023-02-08: qty 1

## 2023-02-08 MED ORDER — STROKE: EARLY STAGES OF RECOVERY BOOK: Freq: Once | Status: AC

## 2023-02-08 MED ORDER — ACETAMINOPHEN 325 MG PO TABS
650.0000 mg | ORAL_TABLET | Freq: Once | ORAL | Status: AC
  Administered 2023-02-08: 650 mg via ORAL
  Filled 2023-02-08: qty 2

## 2023-02-08 MED ORDER — KETOROLAC TROMETHAMINE 15 MG/ML IJ SOLN
15.0000 mg | Freq: Once | INTRAMUSCULAR | Status: AC
  Administered 2023-02-08: 15 mg via INTRAVENOUS
  Filled 2023-02-08: qty 1

## 2023-02-08 MED ORDER — ASPIRIN 81 MG PO CHEW
324.0000 mg | CHEWABLE_TABLET | Freq: Once | ORAL | Status: AC
  Administered 2023-02-08: 324 mg via ORAL
  Filled 2023-02-08: qty 4

## 2023-02-08 MED ORDER — ACETAMINOPHEN 160 MG/5ML PO SOLN: 650.0000 mg | ORAL | Status: DC | PRN

## 2023-02-08 NOTE — ED Provider Notes (Signed)
[place] [facility] DEPARTMENT AT [facility] Provider Note   CSN: [id number] Arrival date & time: 02/08/23  1035     History  Chief Complaint  Patient presents with   Chest Pain    Aminta Sakurai is a 62 y.o. female with a past medical history significant for fibromyalgia, chronic fatigue syndrome, hyperlipidemia, hypothyroidism, IBS, and endometriosis who presents to the ED due to sudden onset of left-sided chest pain that started 1 hour prior to arrival.  Patient states she was sitting in class when the chest pain began.  Chest pain located below left breast and is nonradiating in nature.  Describes chest pain as a sharp sensation that waxes and wanes in severity however, has been constant for the past hour.  Admits to feeling unwell since chest pain started.  She admits to generalized weakness and lightheadedness. Admits to some speech changes earlier that resolved.  No nausea or vomiting.  Denies diaphoresis.  Saw her cardiologist Dr. Wyline Mood on 10/4 where a Zio patch was placed due to syncope versus possible TIA.  Benign cardiac Dopplers per cardiology note. No relationship to exertion. Denies lower extremity edema. No history of blood clots.  History obtained from patient and past medical records. No interpreter used during encounter.       Home Medications Prior to Admission medications   Medication Sig Start Date End Date Taking? Authorizing Provider  acetaminophen (TYLENOL) 500 MG tablet Take 1 tablet (500 mg total) by mouth every 6 (six) hours as needed (pain). 02/22/22   Selmer Dominion, NP  aspirin EC 81 MG tablet Take 81 mg by mouth daily. Swallow whole.    [provider]  B Complex-Folic Acid (B COMPLEX PLUS) TABS Take by mouth daily.    [provider]  Cholecalciferol (VITAMIN D-3 PO) Take by mouth daily.    [provider]  DHA-EPA-Vitamin E (OMEGA-3 COMPLEX PO) Take by mouth daily.    [provider]  DULoxetine (CYMBALTA)  30 MG capsule Take 30 mg by mouth daily.    [provider]  estradiol (VIVELLE-DOT) 0.0375 MG/24HR Place 1 patch onto the skin 2 (two) times a week.    [provider]  MALIC ACID PO Take by mouth daily. W/  MAGNESIUM    [provider]  metaxalone (SKELAXIN) 800 MG tablet Take 1 tablet by mouth as needed for muscle spasms. 02/20/12   [provider]  Misc Natural Products (ADRENAL PO) Take by mouth daily. Adrenal support    [provider]  Multiple Vitamin (MULTIVITAMIN ADULT PO) Take by mouth daily.    [provider]  Multiple Vitamins-Minerals (ZINC PO) Take by mouth daily.    [provider]  NONFORMULARY OR COMPOUNDED ITEM daily. Testosterone cream (compound)    [provider]  OVER THE COUNTER MEDICATION daily. IODIZYME SUPPLEMENT    [provider]  Prasterone, DHEA, (DHEA ADVANCED FORMULA PO) Take by mouth daily.    [provider]  Probiotic Product (ULTRAFLORA IMMUNE HEALTH PO) Take by mouth daily.    [provider]  progesterone (PROMETRIUM) 100 MG capsule Take 100 mg by mouth daily.    [provider]  QUERCETIN PO Take by mouth daily.    [provider]  Saccharomyces boulardii (DAILY PROBIOTIC SUPPLEMENT PO) Take by mouth daily.    [provider]  Specialty Vitamins Products (BRAIN MIGHT/DHA & CO Q10) TABS Take by mouth daily at 12 noon. BRAIN SUPPORT    [provider]  thyroid (ARMOUR THYROID) 30 MG tablet Take 60 mg by mouth daily before breakfast.    [provider]  TYROSINE PO Take by mouth daily. W/  IODINE    [provider]  UBIQUINOL LIPOSOMAL PO Take by mouth daily.    [provider]  vitamin k 100 MCG tablet Take 100 mcg by mouth daily.    [provider]      Allergies    Codeine, Diazepam, Gluten meal, Morphine, and Topiramate    Review of Systems   Review of Systems  Constitutional:   Positive for fatigue.  Respiratory:  Negative for shortness of breath.   Cardiovascular:  Positive for chest pain. Negative for leg swelling.  Neurological:  Positive for weakness.    Physical Exam Updated Vital Signs BP 125/81   Pulse 81   Temp 98.7 F (37.1 C) (Oral)   Resp 18   Ht 5\' 1"  (1.549 m)   Wt 83.9 kg   SpO2 97%   BMI 34.96 kg/m  Physical Exam Vitals and nursing note reviewed.  Constitutional:      General: She is not in acute distress.    Appearance: She is not ill-appearing.  HENT:     Head: Normocephalic.  Eyes:     Pupils: Pupils are equal, round, and reactive to light.  Cardiovascular:     Rate and Rhythm: Normal rate and regular rhythm.     Pulses: Normal pulses.     Heart sounds: Normal heart sounds. No murmur heard.    No friction rub. No gallop.  Pulmonary:     Effort: Pulmonary effort is normal.     Breath sounds: Normal breath sounds.  Chest:     Comments: Reproducible tenderness below left breast Abdominal:     General: Abdomen is flat. There is no distension.     Palpations: Abdomen is soft.     Tenderness: There is no abdominal tenderness. There is no guarding or rebound.  Musculoskeletal:        General: Normal range of motion.     Cervical back: Neck supple.     Comments: No lower extremity edema.  Skin:    General: Skin is warm and dry.  Neurological:     General: No focal deficit present.     Mental Status: She is alert.  Psychiatric:        Mood and Affect: Mood normal.        Behavior: Behavior normal.     ED Results / Procedures / Treatments   Labs (all labs ordered are listed, but only abnormal results are displayed) Labs Reviewed  BASIC METABOLIC PANEL - Abnormal; Notable for the following components:      Result Value   Glucose, Bld 105 (*)    All other components within normal limits  CBC  D-DIMER, QUANTITATIVE  HEPATIC FUNCTION PANEL  LIPASE, BLOOD  TROPONIN I (HIGH SENSITIVITY)  TROPONIN I (HIGH SENSITIVITY)     EKG EKG Interpretation Date/Time:  Wednesday February 08 2023 10:50:09 EDT Ventricular Rate:  81 PR Interval:  166 QRS Duration:  70 QT Interval:  380 QTC Calculation: 441 R Axis:   16  Text Interpretation: Normal sinus rhythm Normal ECG When compared with ECG of 03-Feb-2023 10:18, No significant change was found Confirmed by Estanislado Pandy [phone]) on 02/08/2023 10:55:22 AM  Radiology DG Chest 2 View  Result Date: 02/08/2023 CLINICAL DATA:  Chest pain. EXAM: CHEST - 2 VIEW COMPARISON:  11/01/2021. FINDINGS:  Bilateral lung fields are clear. Bilateral costophrenic angles are clear. Normal cardio-mediastinal silhouette. No acute osseous abnormalities. The soft tissues are within normal limits. Battery pack is seen overlying the left upper anterior chest wall. There are surgical clips in the right upper quadrant, typical of a previous cholecystectomy. IMPRESSION: 1. No active cardiopulmonary disease. Electronically Signed   By: Jules Schick M.D.   On: 02/08/2023 11:41    Procedures Procedures    Medications Ordered in ED Medications  aspirin chewable tablet 324 mg (324 mg Oral Given 02/08/23 1131)  methocarbamol (ROBAXIN) tablet 500 mg (500 mg Oral Given 02/08/23 1443)  acetaminophen (TYLENOL) tablet 650 mg (650 mg Oral Given 02/08/23 1443)    ED Course/ Medical Decision Making/ A&P Clinical Course as of 02/08/23 1621  Wed Feb 08, 2023  1410 Reassessed patient. Still admits to chest pain, now radiating to center of chest. Added d-dimer.  [CA]  1454 D-Dimer, Quant: 0.47 [CA]    Clinical Course User Index [CA] Mannie Stabile, PA-C                                 Medical Decision Making Amount and/or Complexity of Data Reviewed External Data Reviewed: notes.    Details: Cardiology note Labs: ordered. Decision-making details documented in ED Course. Radiology: ordered and independent interpretation performed. Decision-making details documented in ED Course. ECG/medicine  tests: ordered and independent interpretation performed. Decision-making details documented in ED Course.  Risk OTC drugs. Prescription drug management. Decision regarding hospitalization.   This patient presents to the ED for concern of chest pain, this involves an extensive number of treatment options, and is a complaint that carries with it a high risk of complications and morbidity.  The differential diagnosis includes ACS, PE, aortic dissection, MSK etiology, etc  62 year old female presents to the ED to sudden onset of left-sided chest pain that started 1 hour prior to arrival.  Recently saw cardiology on 10/4 where Zio patch was placed due to syncope and possible TIA.  Chest pain has been constant for the past hour.  Chest pain associated with generalized weakness and feeling "unwell".  Upon arrival, patient afebrile, not tachycardic or hypoxic.  Patient in no acute distress.  Reproducible tenderness below left breast.  No lower extremity edema.  ASA given in triage. No rash to suggest shingles. Cardiac labs ordered.  EKG without ST elevations.   CBC unremarkable.  No leukocytosis.  Normal hemoglobin.  Troponin x 2 normal.  EKG demonstrates normal sinus rhythm.  No signs of acute ischemia.  Low suspicion for ACS.  Chest x-ray personally reviewed and interpreted which is negative for signs of pneumonia, pneumothorax, or widened mediastinum.  BMP reassuring.  Normal renal function.  No major electrolyte derangements  D-dimer normal.  Low suspicion for PE.  Lipase normal.  Low suspicion for pancreatitis.  Hepatic function panel normal.  Given reproducible nature on exam chest pain could be related to MSK etiology? Atypical in nature.  Low suspicion for PE/DVT.  Presentation nonconcerning for aortic dissection.  Low suspicion for ACS.  3:35 PM upon reassessment, husband at bedside.  Husband states that patient was having some speech difficulties this morning.  Reviewed previous notes.  Cardiology  mentioned concerns about a possible TIA in the past. Another episode in June. Patient admits to feeling "unwell".  Denies visual changes.  No unilateral weakness. Patient describing speech changes as expressive aphasia, but none  currently. MRI brain ordered. Patient will require admission for TIA work-up.   4:10 PM Discussed with Dr. Thad Ranger with neurology who recommends obtaining EEG to rule out seizures. Order placed.   4:19 PM Discussed with Dr. Randol Kern with [facility] who agrees to admit patient  Lives at home Has PCP Hx fibromyalgia       Final Clinical Impression(s) / ED Diagnoses Final diagnoses:  Atypical chest pain  TIA (transient ischemic attack)    Rx / DC Orders ED Discharge Orders     None         Jesusita Oka 02/08/23 1623    Eber Hong, MD 02/08/23 1626

## 2023-02-08 NOTE — H&P (Signed)
[facility] H&P   Patient Demographics:    Valerie Vasquez, is a 62 y.o. female  MRN: [id number]   DOB - May 28, 1960  Admit Date - 02/08/2023  Outpatient Primary MD for the patient is Donetta Potts, MD  Referring MD/NP/PA: Weyman Rodney    Patient coming from: home  Chief Complaint  Patient presents with   Chest Pain      HPI:    Valerie Vasquez  is a 62 y.o. female, with past medical history of fibromyalgia, chronic fatigue syndrome, hyperlipidemia intolerant to statin, hypothyroidism, IBS, endometriosis, she presents to ED secondary to complaints of chest pain, sudden onset, left-sided, 1 hour prior to presentation, pain happened at rest, sharp, constant, report generalized weakness, lightheadedness, as well she does report an episode of slurred speech, tingling numbness around the lips area intermittently over the last couple month, she does report lightheadedness, near syncope for which she saw her cardiologist recently where she was started on Zio patch last Friday. -In ED significant lab abnormalities, EKG with no acute findings, troponins negative x 2, as well D-dimer is negative, but given her neurological symptoms, [facility] hospitalist consulted to admit due to concern for TIA.    Review of systems:      A full 10 point Review of Systems was done, except as stated above, all other Review of Systems were negative.   With Past History of the following :    Past Medical History:  Diagnosis Date   Cervical stump prolapse    Chronic fatigue syndrome    Endometriosis    Fibromyalgia    History of chest pain    unspecified chest pain evaluated by cardiologist, dr m. branch note in epic 11-25-2021  no further work-up needed   History of uterine fibroid    Hyperlipidemia    Hypothyroidism    followed by [facility]   IBS (irritable bowel syndrome)    PONV  (postoperative nausea and vomiting)    Pre-diabetes    Prolapse of anterior vaginal wall    SUI (stress urinary incontinence, female)    Wears glasses       Past Surgical History:  Procedure Laterality Date   APPENDECTOMY  2007   at same time of D & C    BLADDER SUSPENSION N/A 03/16/2022   Procedure: TRANSVAGINAL TAPE (TVT) PROCEDURE;  Surgeon: Marguerita Beards, MD;  Location: [facility];  Service: Gynecology;  Laterality: N/A;   BREAST REDUCTION SURGERY Bilateral 06/2012   CYSTOSCOPY N/A 03/16/2022   Procedure: CYSTOSCOPY;  Surgeon: Marguerita Beards, MD;  Location: [facility];  Service: Gynecology;  Laterality: N/A;   DILATATION & CURETTAGE/HYSTEROSCOPY WITH MYOSURE  08/17/2007   in [place];  myomectomy   HYSTEROSCOPY WITH NOVASURE  07/29/2005   in [place];  NOVASURE ENDOMETRIAL ABLATION AND   LAPAROSCOPIC LEFT OVARIN CYSTECTOMY   LAPAROSCOPIC CHOLECYSTECTOMY  1994   LAPAROSCOPIC SUPRACERVICAL HYSTERECTOMY Bilateral 12/21/2007   in [place];   W/  BILATERAL SALPINOOPHORECTOMY   LAPAROSCOPIC TUBAL LIGATION Bilateral 1990   in [place]   PELVIC LAPAROSCOPY  01/15/2003   in [place];   LAPAROSCOPIC LEFT SALPINGECTOMY/  RIGHT PARTIAL SALPINGECOTMY/  LYSIS ADHESIONS/  AND D&C HYSTEROSCOPY   ROBOTIC ASSISTED LAPAROSCOPIC SACROCOLPOPEXY N/A 03/16/2022   Procedure: XI ROBOTIC ASSISTED LAPAROSCOPIC SACROCOLPOPEXY;  Surgeon: Marguerita Beards, MD;  Location: [facility];  Service: Gynecology;  Laterality: N/A;   TRACHELECTOMY N/A 03/16/2022   Procedure: TRACHELECTOMY;  Surgeon: Marguerita Beards, MD;  Location: [facility];  Service: Gynecology;  Laterality: N/A;  total time requested is 3.5 hours      Social History:     Social History   Tobacco Use   Smoking status: Never   Smokeless tobacco: Never  Substance Use Topics   Alcohol use: Not Currently       Family History :     Family History   Problem Relation Age of Onset   Hypertension Mother    Atrial fibrillation Mother    Parkinson's disease Father    Hypertension Brother    Stroke Maternal Grandmother    Diabetes Paternal Grandmother       Home Medications:   Prior to Admission medications   Medication Sig Start Date End Date Taking? Authorizing Provider  aspirin EC 81 MG tablet Take 81 mg by mouth daily. Swallow whole.   Yes [provider]  B Complex-Folic Acid (B COMPLEX PLUS) TABS Take by mouth daily.   Yes [provider]  carboxymethylcellulose (REFRESH PLUS) 0.5 % SOLN Place 2 drops into both eyes daily as needed (dry eyes).   Yes [provider]  Cholecalciferol (VITAMIN D-3 PO) Take by mouth daily.   Yes [provider]  DHA-EPA-Vitamin E (OMEGA-3 COMPLEX PO) Take by mouth daily.   Yes [provider]  estradiol (VIVELLE-DOT) 0.0375 MG/24HR Place 1 patch onto the skin 2 (two) times a week.   Yes [provider]  Ibuprofen (ADVIL) 200 MG CAPS Take 3-4 capsules by mouth daily as needed (pain).   Yes [provider]  metaxalone (SKELAXIN) 800 MG tablet Take 1 tablet by mouth as needed for muscle spasms. 02/20/12  Yes [provider]  Misc Natural Products (ADRENAL PO) Take by mouth daily. Adrenal support   Yes [provider]  Multiple Vitamin (MULTIVITAMIN ADULT PO) Take by mouth daily.   Yes [provider]  Multiple Vitamins-Minerals (ZINC PO) Take by mouth daily.   Yes [provider]  NONFORMULARY OR COMPOUNDED ITEM daily. Testosterone cream (compound)   Yes [provider]  OVER THE COUNTER MEDICATION daily. IODIZYME SUPPLEMENT   Yes [provider]  Prasterone, DHEA, (DHEA ADVANCED FORMULA PO) Take by mouth daily.   Yes [provider]  Probiotic Product (ULTRAFLORA IMMUNE HEALTH PO) Take by mouth daily.   Yes [provider]  progesterone (PROMETRIUM) 100 MG capsule Take  100 mg by mouth daily.   Yes [provider]  QUERCETIN PO Take by mouth daily.   Yes [provider]  Saccharomyces boulardii (DAILY PROBIOTIC SUPPLEMENT PO) Take by mouth daily.   Yes [provider]  Specialty Vitamins Products (BRAIN MIGHT/DHA & CO Q10) TABS Take by mouth daily at 12 noon. BRAIN SUPPORT   Yes [provider]  thyroid (ARMOUR THYROID) 30 MG tablet Take 60 mg by mouth daily before  breakfast.   Yes [provider]  TYROSINE PO Take by mouth daily. W/  IODINE   Yes [provider]  UBIQUINOL LIPOSOMAL PO Take by mouth daily.   Yes [provider]  vitamin k 100 MCG tablet Take 100 mcg by mouth daily.   Yes [provider]  DULoxetine (CYMBALTA) 30 MG capsule Take 30 mg by mouth daily.    [provider]     Allergies:     Allergies  Allergen Reactions   Codeine     High doses cause severe chest tightness    Diazepam     High doses cause sever chest tightness   Gluten Meal     Gluten free diet   Morphine     High doses cause severe chest tightness   Topiramate Other (See Comments)    Per pt suicidal     Physical Exam:   Vitals  Blood pressure 125/81, pulse 81, temperature 98.7 F (37.1 C), temperature source Oral, resp. rate 18, height 5\' 1"  (1.549 m), weight 83.9 kg, SpO2 97%.   1. General well developed female  lying in bed in NAD,    2. Normal affect and insight, Not Suicidal or Homicidal, Awake Alert, Oriented X 3.  3. No F.N deficits, ALL C.Nerves Intact, Strength 5/5 all 4 extremities, Sensation intact all 4 extremities, Plantars down going.  4. Ears and Eyes appear Normal, Conjunctivae clear, PERRLA. Moist Oral Mucosa.  5. Supple Neck, No JVD, No cervical lymphadenopathy appriciated, No Carotid Bruits.  6. Symmetrical Chest wall movement, Good air movement bilaterally, CTAB.  7. RRR, No Gallops, Rubs or Murmurs, No Parasternal Heave.  8. Positive Bowel Sounds,  Abdomen Soft, No tenderness, No organomegaly appriciated,No rebound -guarding or rigidity.  9.  No Cyanosis, Normal Skin Turgor, No Skin Rash or Bruise.  10. Good muscle tone,  joints appear normal , no effusions, Normal ROM.     Data Review:    CBC Recent Labs  Lab 02/08/23 1106  WBC 5.9  HGB 14.8  HCT 44.8  PLT 275  MCV 89.2  MCH 29.5  MCHC 33.0  RDW 12.0   ------------------------------------------------------------------------------------------------------------------  Chemistries  Recent Labs  Lab 02/08/23 1106 02/08/23 1302  NA 137  --   K 4.1  --   CL 102  --   CO2 26  --   GLUCOSE 105*  --   BUN 15  --   CREATININE 0.74  --   CALCIUM 8.9  --   AST  --  23  ALT  --  21  ALKPHOS  --  94  BILITOT  --  0.5   ------------------------------------------------------------------------------------------------------------------ estimated creatinine clearance is 72.5 mL/min (by C-G formula based on SCr of 0.74 mg/dL). ------------------------------------------------------------------------------------------------------------------ No results for input(s): "TSH", "T4TOTAL", "T3FREE", "THYROIDAB" in the last 72 hours.  Invalid input(s): "FREET3"  Coagulation profile No results for input(s): "INR", "PROTIME" in the last 168 hours. ------------------------------------------------------------------------------------------------------------------- Recent Labs    02/08/23 1106  DDIMER 0.47   -------------------------------------------------------------------------------------------------------------------  Cardiac Enzymes No results for input(s): "CKMB", "TROPONINI", "MYOGLOBIN" in the last 168 hours.  Invalid input(s): "CK" ------------------------------------------------------------------------------------------------------------------ No results found for:  "BNP"   ---------------------------------------------------------------------------------------------------------------  Urinalysis    Component Value Date/Time   COLORURINE STRAW (A) 11/10/2019 2117   APPEARANCEUR CLEAR 11/10/2019 2117   LABSPEC 1.006 11/10/2019 2117   PHURINE 7.0 11/10/2019 2117   GLUCOSEU NEGATIVE 11/10/2019 2117   HGBUR NEGATIVE 11/10/2019 2117   BILIRUBINUR Negative 12/13/2021 1537  KETONESUR NEGATIVE 11/10/2019 2117   PROTEINUR Negative 12/13/2021 1537   PROTEINUR NEGATIVE 11/10/2019 2117   UROBILINOGEN 0.2 12/13/2021 1537   NITRITE Negative 12/13/2021 1537   NITRITE NEGATIVE 11/10/2019 2117   LEUKOCYTESUR Negative 12/13/2021 1537   LEUKOCYTESUR TRACE (A) 11/10/2019 2117    ----------------------------------------------------------------------------------------------------------------   Imaging Results:    DG Chest 2 View  Result Date: 02/08/2023 CLINICAL DATA:  Chest pain. EXAM: CHEST - 2 VIEW COMPARISON:  11/01/2021. FINDINGS: Bilateral lung fields are clear. Bilateral costophrenic angles are clear. Normal cardio-mediastinal silhouette. No acute osseous abnormalities. The soft tissues are within normal limits. Battery pack is seen overlying the left upper anterior chest wall. There are surgical clips in the right upper quadrant, typical of a previous cholecystectomy. IMPRESSION: 1. No active cardiopulmonary disease. Electronically Signed   By: Jules Schick M.D.   On: 02/08/2023 11:41     EKG: R Vent. rate 81 BPM PR interval 166 ms QRS duration 70 ms QT/QTcB 380/441 ms P-R-T axes 61 16 67 Normal sinus rhythm Normal ECG When compared with ECG of 03-Feb-2023 10:18, No significant change was found   Assessment & Plan:    Principal Problem:   TIA (transient ischemic attack) Active Problems:   Chest pain   Anxiety   Slurred speech-dizziness, concern for TIA -Presents with multiple complaints including tingling numbness, slurred speech,  dizziness, concern for TIA-admitted under TIA pathway, received full dose aspirin, will check MRI head, MRI brain with and without contrast, 2D echo, lipid panel, A1c, will consult PT and OT. -He received full dose aspirin in ED, on aspirin 81 mg oral daily at home, will continue for now. -Doppler done as an outpatient 01/20/2023, significant for minimal homogeneous plaque, no hemodynamically significant stenosis by duplex criteria.  No need to repeat -Patient with Zio patch, inserted last Friday, she is followed by Dr. Wyline Mood -On ED discussion with neuro neurology, they recommended EEG as well, so it has been ordered.  Headedness -Will check orthostatics, she has a Zio patch, she will be on telemetry monitor  Chest pain -Pickel, musculoskeletal, reproducible by palpation, negative troponins, negative D-dimers, EKG nonacute  Hyperlipidemia -She is intolerant to statin, by reviewing her notes apparently PCP was going to start [id number] but insurance did not cover.     DVT Prophylaxis Heparin AM Labs Ordered, also please review Full Orders  Family Communication: Admission, patients condition and plan of care including tests being ordered have been discussed with the patient and has been at bedside* who indicate understanding and agree with the plan and Code Status.  Code Status full code  Likely DC to home  Condition GUARDED  Consults called: ED discussed with neurology  Admission status: Observation  Time spent in minutes : 65 minutes   Huey Bienenstock M.D on 02/08/2023 at 4:55 PM   [facility] - Office  [phone]

## 2023-02-08 NOTE — ED Triage Notes (Signed)
Chest pain, weakness and lightheaded intermittently since June . Pt states this episode started 1 hour ago. Currently wearing a heart monitor. Pain stays on left side does not radiate. Denies nausea and vomiting.

## 2023-02-09 ENCOUNTER — Other Ambulatory Visit (HOSPITAL_COMMUNITY): Payer: Self-pay | Admitting: *Deleted

## 2023-02-09 ENCOUNTER — Observation Stay (HOSPITAL_COMMUNITY): Payer: BC Managed Care – PPO

## 2023-02-09 ENCOUNTER — Observation Stay (HOSPITAL_COMMUNITY)
Admit: 2023-02-09 | Discharge: 2023-02-09 | Disposition: A | Discharge status: Home / Self Care | Payer: BC Managed Care – PPO | Attending: Internal Medicine | Admitting: Internal Medicine

## 2023-02-09 DIAGNOSIS — R29818 Other symptoms and signs involving the nervous system: Secondary | ICD-10-CM | POA: Diagnosis not present

## 2023-02-09 DIAGNOSIS — K219 Gastro-esophageal reflux disease without esophagitis: Secondary | ICD-10-CM

## 2023-02-09 DIAGNOSIS — G459 Transient cerebral ischemic attack, unspecified: Secondary | ICD-10-CM

## 2023-02-09 DIAGNOSIS — F419 Anxiety disorder, unspecified: Secondary | ICD-10-CM

## 2023-02-09 DIAGNOSIS — R569 Unspecified convulsions: Secondary | ICD-10-CM | POA: Diagnosis not present

## 2023-02-09 DIAGNOSIS — R079 Chest pain, unspecified: Secondary | ICD-10-CM

## 2023-02-09 DIAGNOSIS — R0789 Other chest pain: Secondary | ICD-10-CM

## 2023-02-09 LAB — LIPID PANEL
Cholesterol: 171 mg/dL (ref 0–200)
HDL: 38 mg/dL — ABNORMAL LOW (ref >40)
LDL Cholesterol: 105 mg/dL — ABNORMAL HIGH (ref 0–99)
Total CHOL/HDL Ratio: 4.5 {ratio}
Triglycerides: 139 mg/dL (ref <150)
VLDL: 28 mg/dL (ref 0–40)

## 2023-02-09 LAB — ECHOCARDIOGRAM COMPLETE
Area-P 1/2: 3.17 cm2
Height: 61 in
S' Lateral: 2.7 cm
Weight: 2960 [oz_av]

## 2023-02-09 LAB — VITAMIN B12: Vitamin B-12: 643 pg/mL (ref 180–914)

## 2023-02-09 LAB — FOLATE: Folate: 31.6 ng/mL (ref >5.9)

## 2023-02-09 LAB — HEMOGLOBIN A1C
Hgb A1c MFr Bld: 5.8 % — ABNORMAL HIGH (ref 4.8–5.6)
Mean Plasma Glucose: 119.76 mg/dL

## 2023-02-09 LAB — HIV ANTIBODY (ROUTINE TESTING W REFLEX): HIV Screen 4th Generation wRfx: NONREACTIVE

## 2023-02-09 LAB — TSH: TSH: 0.646 u[IU]/mL (ref 0.350–4.500)

## 2023-02-09 MED ORDER — ATORVASTATIN CALCIUM 20 MG PO TABS: 20.0000 mg | ORAL_TABLET | Freq: Every day | ORAL | Status: DC

## 2023-02-09 MED ORDER — GABAPENTIN 300 MG PO CAPS: 300.0000 mg | ORAL_CAPSULE | Freq: Two times a day (BID) | ORAL | 0 refills | Status: DC

## 2023-02-09 MED ORDER — PANTOPRAZOLE SODIUM 40 MG PO TBEC: 40.0000 mg | DELAYED_RELEASE_TABLET | Freq: Every day | ORAL | 1 refills | Status: AC

## 2023-02-09 MED ORDER — PANTOPRAZOLE SODIUM 40 MG PO TBEC
40.0000 mg | DELAYED_RELEASE_TABLET | Freq: Every day | ORAL | Status: DC
  Administered 2023-02-09: 40 mg via ORAL
  Filled 2023-02-09: qty 1

## 2023-02-09 MED ORDER — BUTALBITAL-APAP-CAFFEINE 50-325-40 MG PO TABS
1.0000 | ORAL_TABLET | Freq: Two times a day (BID) | ORAL | Status: DC | PRN
  Administered 2023-02-09: 1 via ORAL
  Filled 2023-02-09: qty 1

## 2023-02-09 MED ORDER — ATORVASTATIN CALCIUM 40 MG PO TABS: 40.0000 mg | ORAL_TABLET | Freq: Every day | ORAL | Status: DC

## 2023-02-09 MED ORDER — GABAPENTIN 300 MG PO CAPS
300.0000 mg | ORAL_CAPSULE | Freq: Two times a day (BID) | ORAL | Status: DC
  Administered 2023-02-09: 300 mg via ORAL
  Filled 2023-02-09: qty 1

## 2023-02-09 MED ORDER — ATORVASTATIN CALCIUM 20 MG PO TABS: 20.0000 mg | ORAL_TABLET | ORAL | 1 refills | Status: DC

## 2023-02-09 MED ORDER — BUTALBITAL-APAP-CAFFEINE 50-325-40 MG PO TABS: 1.0000 | ORAL_TABLET | Freq: Three times a day (TID) | ORAL | 0 refills | Status: AC | PRN

## 2023-02-09 MED ORDER — PERFLUTREN LIPID MICROSPHERE
1.0000 mL | INTRAVENOUS | Status: AC | PRN
  Administered 2023-02-09: 3 mL via INTRAVENOUS

## 2023-02-09 NOTE — Procedures (Signed)
Patient Name: Valerie Vasquez  MRN: [id number]  Epilepsy Attending: Charlsie Quest  Referring Physician/Provider: Starleen Arms, MD  Date: 02/09/2023  Duration: 23.36 mins  Patient history: 62 year old female with transient lightheadedness, tingling numbness and trouble speaking since end of June, happening every few weeks lasting for about 12 hours on average. EEG to evaluate for seizure  Level of alertness: Awake  AEDs during EEG study: GBP  Technical aspects: This EEG study was done with scalp electrodes positioned according to the 10-20 International system of electrode placement. Electrical activity was reviewed with band pass filter of 1-70Hz , sensitivity of 7 uV/mm, display speed of 36mm/sec with a 60Hz  notched filter applied as appropriate. EEG data were recorded continuously and digitally stored.  Video monitoring was available and reviewed as appropriate.  Description: The posterior dominant rhythm consists of 9 Hz activity of moderate voltage (25-35 uV) seen predominantly in posterior head regions, symmetric and reactive to eye opening and eye closing. There is 15 to 18 Hz beta activity distributed symmetrically and diffusely. Hyperventilation and photic stimulation were not performed.     IMPRESSION: This study is within normal limits. No seizures or epileptiform discharges were seen throughout the recording.  A normal interictal EEG does not exclude the diagnosis of epilepsy.   Jossue Rubenstein Annabelle Harman

## 2023-02-09 NOTE — Evaluation (Signed)
Occupational Therapy Evaluation Patient Details Name: Valerie Vasquez MRN: [id number] DOB: 1961-01-07 Today's Date: 02/09/2023   History of Present Illness Valerie Vasquez  is a 62 y.o. female, with past medical history of fibromyalgia, chronic fatigue syndrome, hyperlipidemia intolerant to statin, hypothyroidism, IBS, endometriosis, she presents to ED secondary to complaints of chest pain, sudden onset, left-sided, 1 hour prior to presentation, pain happened at rest, sharp, constant, report generalized weakness, lightheadedness, as well she does report an episode of slurred speech, tingling numbness around the lips area intermittently over the last couple month, she does report lightheadedness, near syncope for which she saw her cardiologist recently where she was started on Zio patch last Friday.  -In ED significant lab abnormalities, EKG with no acute findings, troponins negative x 2, as well D-dimer is negative, but given her neurological symptoms, [facility] hospitalist consulted to admit due to concern for TIA. (per MD)   Clinical Impression   Pt agreeable to OT and PT co-evaluation. Pt is independent at baseline and appears to be at baseline function today. Pt ambulated independently in the room. WFL B UE A/ROM and strength. Independent ADL's based on observation and clinical judgment. Pt is not recommended for further acute OT services and will be discharged to care of nursing staff for remaining length of stay.                Functional Status Assessment  Patient has not had a recent decline in their functional status  Equipment Recommendations  None recommended by OT           Precautions / Restrictions Precautions Precautions: None Restrictions Weight Bearing Restrictions: No      Mobility Bed Mobility Overal bed mobility: Independent                  Transfers Overall transfer level: Independent Equipment used: None               General transfer comment:  Ambulated without assist.      Balance Overall balance assessment: Independent                                         ADL either performed or assessed with clinical judgement   ADL Overall ADL's : Independent                                             Vision Baseline Vision/History: 1 Wears glasses Ability to See in Adequate Light: 0 Adequate Patient Visual Report: No change from baseline Vision Assessment?: No apparent visual deficits     Perception Perception: Not tested       Praxis Praxis: WFL       Pertinent Vitals/Pain Pain Assessment Pain Assessment: Faces Faces Pain Scale: Hurts a little bit Pain Location: head, chest, neck Pain Descriptors / Indicators: Headache Pain Intervention(s): Limited activity within patient's tolerance, Monitored during session, Repositioned     Extremity/Trunk Assessment Upper Extremity Assessment Upper Extremity Assessment: Overall WFL for tasks assessed   Lower Extremity Assessment Lower Extremity Assessment: Defer to PT evaluation   Cervical / Trunk Assessment Cervical / Trunk Assessment: Normal   Communication Communication Communication: No apparent difficulties   Cognition Arousal: Alert Behavior During Therapy: WFL for tasks assessed/performed, Flat affect Overall Cognitive Status: Within  Functional Limits for tasks assessed                                                        Home Living Family/patient expects to be discharged to:: Private residence Living Arrangements: Spouse/significant other Available Help at Discharge: Family;Available PRN/intermittently Type of Home: House Home Access: Stairs to enter [employer] of Steps: 4 Entrance Stairs-Rails: Left (gonig up) Home Layout: One level     Bathroom Shower/Tub: Producer, television/film/video: Standard Bathroom Accessibility: Yes How Accessible: Accessible via  wheelchair;Accessible via walker Home Equipment: Shower seat - built in          Prior Functioning/Environment Prior Level of Function : Independent/Modified Independent             Mobility Comments: Tourist information centre manager without AD; drives ADLs Comments: Independent                                Co-evaluation PT/OT/SLP Co-Evaluation/Treatment: Yes Reason for Co-Treatment: To address functional/ADL transfers   OT goals addressed during session: ADL's and self-care      AM-PAC OT "6 Clicks" Daily Activity     Outcome Measure Help from another person eating meals?: None Help from another person taking care of personal grooming?: None Help from another person toileting, which includes using toliet, bedpan, or urinal?: None Help from another person bathing (including washing, rinsing, drying)?: None Help from another person to put on and taking off regular upper body clothing?: None Help from another person to put on and taking off regular lower body clothing?: None 6 Click Score: 24   End of Session    Activity Tolerance: Patient tolerated treatment well Patient left: in bed;with call bell/phone within reach;with family/visitor present  OT Visit Diagnosis: Dizziness and giddiness (R42);Pain;Other symptoms and signs involving the nervous system (R29.898) Pain - part of body:  (chest)                Time: 8119-1478 OT Time Calculation (min): 11 min Charges:  OT General Charges $OT Visit: 1 Visit OT Evaluation $OT Eval Low Complexity: 1 Low  Valerie Vasquez OT, MOT  Danie Chandler 02/09/2023, 9:26 AM

## 2023-02-09 NOTE — Progress Notes (Signed)
*  PRELIMINARY RESULTS* Echocardiogram 2D Echocardiogram has been performed with [facility].  Stacey Drain 02/09/2023, 3:36 PM

## 2023-02-09 NOTE — Plan of Care (Signed)
  Problem: Education: Goal: Knowledge of the prescribed therapeutic regimen will improve Outcome: Progressing  *Pt able to state importance of medication regimen. Goal: Understanding of sexual limitations or changes related to disease process or condition will improve Outcome: Progressing Goal: Individualized Educational Video(s) Outcome: Progressing   Problem: Self-Concept: Goal: Communication of feelings regarding changes in body function or appearance will improve Outcome: Progressing   Problem: Skin Integrity: Goal: Demonstration of wound healing without infection will improve Outcome: Progressing   *Pt with no evidence of altered skin integrity   Problem: Education: Goal: Knowledge of General Education information will improve Description: Including pain rating scale, medication(s)/side effects and non-pharmacologic comfort measures Outcome: Progressing   Problem: Health Behavior/Discharge Planning: Goal: Ability to manage health-related needs will improve Outcome: Progressing   Problem: Clinical Measurements: Goal: Ability to maintain clinical measurements within normal limits will improve Outcome: Progressing Goal: Will remain free from infection Outcome: Progressing Goal: Diagnostic test results will improve Outcome: Progressing Goal: Respiratory complications will improve Outcome: Progressing           *No respiratory complications noted  Goal: Cardiovascular complication will be avoided Outcome: Progressing             *No cardiovascular complications noted  Problem: Activity: Goal: Risk for activity intolerance will decrease Outcome: Progressing          *Pt up OOB unassisted as tolerated.   Problem: Nutrition: Goal: Adequate nutrition will be maintained Outcome: Progressing               *Pt eating and drinking without difficulty or n/v.   Problem: Coping: Goal: Level of anxiety will decrease Outcome: Progressing   Problem:  Elimination: Goal: Will not experience complications related to bowel motility Outcome: Progressing Goal: Will not experience complications related to urinary retention Outcome: Progressing   Problem: Pain Managment: Goal: General experience of comfort will improve Outcome: Progressing   Problem: Safety: Goal: Ability to remain free from injury will improve Outcome: Progressing   Problem: Skin Integrity: Goal: Risk for impaired skin integrity will decrease Outcome: Progressing  *Pt with no evidence of altered skin integrity   Problem: Education: Goal: Knowledge of disease or condition will improve Outcome: Progressing Goal: Knowledge of secondary prevention will improve (MUST DOCUMENT ALL) Outcome: Progressing Goal: Knowledge of patient specific risk factors will improve Loraine Leriche N/A or DELETE if not current risk factor) Outcome: Progressing   Problem: Ischemic Stroke/TIA Tissue Perfusion: Goal: Complications of ischemic stroke/TIA will be minimized Outcome: Progressing  Pt with no noted complications from TIA. Pt able to state s/s stroke to report & when to call 911.   Problem: Coping: Goal: Will verbalize positive feelings about self Outcome: Progressing Goal: Will identify appropriate support needs Outcome: Progressing   Problem: Health Behavior/Discharge Planning: Goal: Ability to manage health-related needs will improve Outcome: Progressing Goal: Goals will be collaboratively established with patient/family Outcome: Progressing   Problem: Self-Care: Goal: Ability to participate in self-care as condition permits will improve Outcome: Progressing Goal: Verbalization of feelings and concerns over difficulty with self-care will improve Outcome: Progressing Goal: Ability to communicate needs accurately will improve Outcome: Progressing   Problem: Nutrition: Goal: Risk of aspiration will decrease Outcome: Progressing Goal: Dietary intake will improve Outcome:  Progressing

## 2023-02-09 NOTE — Progress Notes (Signed)
   02/09/23 1431  TOC Brief Assessment  Insurance and Status Reviewed  Patient has primary care physician Yes  Home environment has been reviewed Yes  Prior level of function: Independent  Prior/Current Home Services No current home services  Social Determinants of Health Reivew SDOH reviewed no interventions necessary  Readmission risk has been reviewed Yes  Transition of care needs no transition of care needs at this time

## 2023-02-09 NOTE — Progress Notes (Signed)
Pt discharged via WC to POV with all personal belongings in her possession. 

## 2023-02-09 NOTE — Progress Notes (Signed)
EEG complete - results pending 

## 2023-02-09 NOTE — Discharge Summary (Signed)
Physician Discharge Summary   Patient: Valerie Vasquez MRN: [id number] DOB: April 14, 1961  Admit date:     02/08/2023  Discharge date: 02/09/23  Discharge Physician: Vassie Loll   PCP: Donetta Potts, MD   Recommendations at discharge:  Repeat basic metabolic panel to follow electrolytes renal function Repeat LFTs in 8-12  weeks to assess liver function trend/stability after initiation of statin. Make sure patient has follow-up with neurology as instructed.. Continue to follow patient's thyroid panel with further adjustment to Synthroid as needed.  Discharge Diagnoses: Principal Problem:   TIA (transient ischemic attack) Active Problems:   Atypical chest pain   Anxiety   Gastroesophageal reflux disease  Brief Hospital admission course: As per H&P written by Dr. Randol Kern on 02/08/2023  Valerie Vasquez  is a 62 y.o. female, with past medical history of fibromyalgia, chronic fatigue syndrome, hyperlipidemia intolerant to statin, hypothyroidism, IBS, endometriosis, she presents to ED secondary to complaints of chest pain, sudden onset, left-sided, 1 hour prior to presentation, pain happened at rest, sharp, constant, report generalized weakness, lightheadedness, as well she does report an episode of slurred speech, tingling numbness around the lips area intermittently over the last couple month, she does report lightheadedness, near syncope for which she saw her cardiologist recently where she was started on Zio patch last Friday. -In ED significant lab abnormalities, EKG with no acute findings, troponins negative x 2, as well D-dimer is negative, but given her neurological symptoms, [facility] hospitalist consulted to admit due to concern for TIA.  Assessment and Plan: Slurred speech/dizziness and lightheadedness; there was concern for TIA -Workup negative for acute stroke or hemorrhagic findings -No significant large vessel occlusion appreciated and good patency of patient's vertebral arteries. -2D  echo without wall motion abnormalities and with preserved ejection fraction -EEG demonstrating no epileptiform wave abnormalities -Case discussed with neurology service who recommended continue aspirin for secondary prevention; start patient on Neurontin twice a day with underlying concern for most likely migraine triggering cause for patient's symptoms. -Patient has been started on Lipitor every other day 20 mg due to elevated LDL. (In effort for secondary prevention).  Hyperlipidemia -Has been started on Lipitor -Follow LFTs and LDL trend.  Headache/migraine -Follow neurology recommendation patient has been initiated on Neurontin twice a day.  Class I obesity -Body mass index is 34.96 kg/m. -Low-calorie diet, portion control and increase physical activity discussed with patient.  Chest pain/GERD -Reassuring 2D echo; negative troponins, no acute ischemic changes on EKG or telemetry.  Negative D-dimer -Patient started on daily PPI.  Hypothyroidism -Continue Synthroid  Grade 1 diastolic dysfunction appreciated on 2D echo -Stable, compensated, slightly chronic in nature -Heart healthy/low-sodium diet discussed with patient -Follow daily weights and continue outpatient follow-up with cardiology service.   Consultants: Neurology service Procedures performed: See below for x-ray report; 2D echo and EEG. Disposition: Home Diet recommendation: Heart healthy/low-sodium and low-fat diet.  DISCHARGE MEDICATION: Allergies as of 02/09/2023       Reactions   Codeine    High doses cause severe chest tightness   Diazepam    High doses cause sever chest tightness   Gluten Meal    Gluten free diet   Morphine    High doses cause severe chest tightness   Topiramate Other (See Comments)   Per pt suicidal        Medication List     STOP taking these medications    Advil 200 MG Caps Generic drug: Ibuprofen   DHEA ADVANCED FORMULA PO   DULoxetine  30 MG capsule Commonly known  as: CYMBALTA   metaxalone 800 MG tablet Commonly known as: SKELAXIN   MULTIVITAMIN ADULT PO   NONFORMULARY OR COMPOUNDED ITEM   OVER THE COUNTER MEDICATION   QUERCETIN PO   TYROSINE PO   UBIQUINOL LIPOSOMAL PO   ULTRAFLORA IMMUNE HEALTH PO       TAKE these medications    ADRENAL PO Take by mouth daily. Adrenal support   Armour Thyroid 30 MG tablet Generic drug: thyroid Take 60 mg by mouth daily before breakfast.   aspirin EC 81 MG tablet Take 81 mg by mouth daily. Swallow whole.   atorvastatin 20 MG tablet Commonly known as: LIPITOR Take 1 tablet (20 mg total) by mouth every other day.   B Complex Plus Tabs Take by mouth daily.   Brain Might/DHA & CO Q10 Tabs Take by mouth daily at 12 noon. BRAIN SUPPORT   butalbital-acetaminophen-caffeine 50-325-40 MG tablet Commonly known as: FIORICET Take 1 tablet by mouth every 8 (eight) hours as needed for headache or migraine.   carboxymethylcellulose 0.5 % Soln Commonly known as: REFRESH PLUS Place 2 drops into both eyes daily as needed (dry eyes).   DAILY PROBIOTIC SUPPLEMENT PO Take by mouth daily.   estradiol 0.0375 MG/24HR Commonly known as: VIVELLE-DOT Place 1 patch onto the skin 2 (two) times a week.   gabapentin 300 MG capsule Commonly known as: NEURONTIN Take 1 capsule (300 mg total) by mouth 2 (two) times daily.   OMEGA-3 COMPLEX PO Take by mouth daily.   pantoprazole 40 MG tablet Commonly known as: PROTONIX Take 1 tablet (40 mg total) by mouth daily. Start taking on: February 10, 2023   progesterone 100 MG capsule Commonly known as: PROMETRIUM Take 100 mg by mouth daily.   VITAMIN D-3 PO Take by mouth daily.   vitamin k 100 MCG tablet Take 100 mcg by mouth daily.   ZINC PO Take by mouth daily.        Follow-up Information     Donetta Potts, MD. Schedule an appointment as soon as possible for a visit in 10 day(s).   Specialty: Internal Medicine Contact information: [address] [place] [address] [phone]                Discharge Exam: Ceasar Mons Weights   02/08/23 1047  Weight: 83.9 kg   General exam: Alert, awake, oriented x 3 Respiratory system: Clear to auscultation. Respiratory effort normal. Cardiovascular system:RRR. No murmurs, rubs, gallops. Gastrointestinal system: Abdomen is nondistended, soft and nontender. No organomegaly or masses felt. Normal bowel sounds heard. Central nervous system: No focal neurological deficits. Extremities: No cyanosis, clubbing or edema. Skin: No rashes, lesions or ulcers Psychiatry: Judgement and insight appear normal. Mood & affect appropriate.    Condition at discharge: Stable and improved.  The results of significant diagnostics from this hospitalization (including imaging, microbiology, ancillary and laboratory) are listed below for reference.   Imaging Studies: EEG adult  Result Date: 02-26-2023 Charlsie Quest, MD     Feb 26, 2023  4:46 PM Patient Name: Valerie Vasquez MRN: [id number] Epilepsy Attending: Charlsie Quest Referring Physician/Provider: Starleen Arms, MD Date: 26-Feb-2023 Duration: 23.36 mins Patient history: 62 year old female with transient lightheadedness, tingling numbness and trouble speaking since end of June, happening every few weeks lasting for about 12 hours on average. EEG to evaluate for seizure Level of alertness: Awake AEDs during EEG study: GBP Technical aspects: This EEG study was done with scalp electrodes  positioned according to the 10-20 International system of electrode placement. Electrical activity was reviewed with band pass filter of 1-70Hz , sensitivity of 7 uV/mm, display speed of 57mm/sec with a 60Hz  notched filter applied as appropriate. EEG data were recorded continuously and digitally stored.  Video monitoring was available and reviewed as appropriate. Description: The posterior dominant rhythm consists of 9 Hz activity of moderate voltage (25-35 uV) seen  predominantly in posterior head regions, symmetric and reactive to eye opening and eye closing. There is 15 to 18 Hz beta activity distributed symmetrically and diffusely. Hyperventilation and photic stimulation were not performed.   IMPRESSION: This study is within normal limits. No seizures or epileptiform discharges were seen throughout the recording. A normal interictal EEG does not exclude the diagnosis of epilepsy. Charlsie Quest   ECHOCARDIOGRAM COMPLETE  Result Date: 02/09/2023    ECHOCARDIOGRAM REPORT   Patient Name:   Valerie Vasquez Date of Exam: 02/09/2023 Medical Rec #:  [id number]   Height:       61.0 in Accession #:    [id number]  Weight:       185.0 lb Date of Birth:  August 13, 1960   BSA:          1.827 m Patient Age:    62 years    BP:           105/55 mmHg Patient Gender: F           HR:           64 bpm. [facility] Procedure: 2D Echo, Cardiac Doppler, Color Doppler and Intracardiac            Opacification Agent Indications:    TIA G45.9  History:        Patient has no prior history of Echocardiogram examinations.                 Risk Factors:Pre-diabetes and Dyslipidemia.  Sonographer:    Celesta Gentile RCS Referring Phys: [phone] DAWOOD S ELGER[place]WY IMPRESSIONS  1. Left ventricular ejection fraction, by estimation, is 55 to 60%. The left ventricle has normal function. The left ventricle has no regional wall motion abnormalities. Left ventricular diastolic parameters are consistent with Grade I diastolic dysfunction (impaired relaxation).  2. Right ventricular systolic function is normal. The right ventricular size is normal. Tricuspid regurgitation signal is inadequate for assessing PA pressure.  3. Left atrial size was mildly dilated.  4. The mitral valve is normal in structure. No evidence of mitral valve regurgitation. No evidence of mitral stenosis.  5. The aortic valve is tricuspid. Aortic valve regurgitation is not visualized. No aortic stenosis is present.  6. The inferior vena  cava is normal in size with greater than 50% respiratory variability, suggesting right atrial pressure of 3 mmHg. Comparison(s): No prior Echocardiogram. FINDINGS  Left Ventricle: Left ventricular ejection fraction, by estimation, is 55 to 60%. The left ventricle has normal function. The left ventricle has no regional wall motion abnormalities. Definity contrast agent was given IV to delineate the left ventricular  endocardial borders. The left ventricular internal cavity size was normal in size. There is no left ventricular hypertrophy. Left ventricular diastolic parameters are consistent with Grade I diastolic dysfunction (impaired relaxation). Normal left ventricular filling pressure. Right Ventricle: The right ventricular size is normal. No increase in right ventricular wall thickness. Right ventricular systolic function is normal. Tricuspid regurgitation signal is inadequate for assessing PA pressure. Left Atrium: Left atrial size was mildly dilated. Right Atrium:  Right atrial size was normal in size. Pericardium: Trivial pericardial effusion is present. Mitral Valve: The mitral valve is normal in structure. No evidence of mitral valve regurgitation. No evidence of mitral valve stenosis. Tricuspid Valve: The tricuspid valve is normal in structure. Tricuspid valve regurgitation is not demonstrated. No evidence of tricuspid stenosis. Aortic Valve: The aortic valve is tricuspid. Aortic valve regurgitation is not visualized. No aortic stenosis is present. Pulmonic Valve: The pulmonic valve was not well visualized. Pulmonic valve regurgitation is not visualized. No evidence of pulmonic stenosis. Aorta: The aortic root is normal in size and structure. Venous: The inferior vena cava is normal in size with greater than 50% respiratory variability, suggesting right atrial pressure of 3 mmHg. IAS/Shunts: No atrial level shunt detected by color flow Doppler.  LEFT VENTRICLE PLAX 2D LVIDd:         4.70 cm   Diastology  LVIDs:         2.70 cm   LV e' medial:    9.03 cm/s LV PW:         1.00 cm   LV E/e' medial:  6.9 LV IVS:        0.90 cm   LV e' lateral:   12.00 cm/s LVOT diam:     2.00 cm   LV E/e' lateral: 5.2 LVOT Area:     3.14 cm  RIGHT VENTRICLE RV S prime:     12.30 cm/s TAPSE (M-mode): 2.4 cm LEFT ATRIUM             Index        RIGHT ATRIUM           Index LA diam:        3.10 cm 1.70 cm/m   RA Area:     18.60 cm LA Vol (A2C):   58.5 ml 32.02 ml/m  RA Volume:   50.30 ml  27.53 ml/m LA Vol (A4C):   69.7 ml 38.15 ml/m LA Biplane Vol: 63.7 ml 34.86 ml/m   AORTA Ao Root diam: 3.60 cm MITRAL VALVE MV Area (PHT): 3.17 cm    SHUNTS MV Decel Time: 239 msec    Systemic Diam: 2.00 cm MV E velocity: 62.70 cm/s MV A velocity: 73.40 cm/s MV E/A ratio:  0.85 Vishnu Priya Mallipeddi Electronically signed by Winfield Rast Mallipeddi Signature Date/Time: 02/09/2023/3:56:10 PM    Final    MR BRAIN W WO CONTRAST  Result Date: 02/08/2023 CLINICAL DATA:  Slurred speech with numbness around the mouth and dizziness. EXAM: MRI HEAD WITHOUT AND WITH CONTRAST MRA HEAD WITHOUT CONTRAST TECHNIQUE: Multiplanar, multi-echo pulse sequences of the brain and surrounding structures were acquired without and with intravenous contrast. Angiographic images of the Circle of Willis were acquired using MRA technique without intravenous contrast. CONTRAST:  9mL GADAVIST GADOBUTROL 1 MMOL/ML IV SOLN COMPARISON:  None Available. FINDINGS: MRI HEAD FINDINGS Brain: No acute infarction, hemorrhage, hydrocephalus, extra-axial collection or mass lesion. No pathologic intracranial enhancement. No abnormal enhancement or white matter disease. Partially empty sella, nonspecific in isolation. Vascular: Normal flow voids. Skull and upper cervical spine: Normal marrow signal. Degenerative facet spurring diffusely where covered. Sinuses/Orbits: No acute or significant finding. MRA HEAD FINDINGS Anterior circulation: Vessels are smoothly contoured and widely patent.  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 The vertebral and basilar arteries are smoothly contoured and widely patent. No branch occlusion, beading, aneurysm, or vascular malformation. IMPRESSION: Brain MRI: No infarct or other explanation  for symptoms. Unremarkable appearance of the brain. Intracranial MRA: Unremarkable. Electronically Signed   By: Tiburcio Pea M.D.   On: 02/08/2023 20:03   MR ANGIO HEAD WO CONTRAST  Result Date: 02/08/2023 CLINICAL DATA:  Slurred speech with numbness around the mouth and dizziness. EXAM: MRI HEAD WITHOUT AND WITH CONTRAST MRA HEAD WITHOUT CONTRAST TECHNIQUE: Multiplanar, multi-echo pulse sequences of the brain and surrounding structures were acquired without and with intravenous contrast. Angiographic images of the Circle of Willis were acquired using MRA technique without intravenous contrast. CONTRAST:  9mL GADAVIST GADOBUTROL 1 MMOL/ML IV SOLN COMPARISON:  None Available. FINDINGS: MRI HEAD FINDINGS Brain: No acute infarction, hemorrhage, hydrocephalus, extra-axial collection or mass lesion. No pathologic intracranial enhancement. No abnormal enhancement or white matter disease. Partially empty sella, nonspecific in isolation. Vascular: Normal flow voids. Skull and upper cervical spine: Normal marrow signal. Degenerative facet spurring diffusely where covered. Sinuses/Orbits: No acute or significant finding. MRA HEAD FINDINGS Anterior circulation: Vessels are smoothly contoured and widely patent. 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 The vertebral and basilar arteries are smoothly contoured and widely patent. No branch occlusion, beading, aneurysm, or vascular malformation. IMPRESSION: Brain MRI: No infarct or other explanation for symptoms. Unremarkable appearance of the brain. Intracranial MRA: Unremarkable. Electronically Signed    By: Tiburcio Pea M.D.   On: 02/08/2023 20:03   DG Chest 2 View  Result Date: 02/08/2023 CLINICAL DATA:  Chest pain. EXAM: CHEST - 2 VIEW COMPARISON:  11/01/2021. FINDINGS: Bilateral lung fields are clear. Bilateral costophrenic angles are clear. Normal cardio-mediastinal silhouette. No acute osseous abnormalities. The soft tissues are within normal limits. Battery pack is seen overlying the left upper anterior chest wall. There are surgical clips in the right upper quadrant, typical of a previous cholecystectomy. IMPRESSION: 1. No active cardiopulmonary disease. Electronically Signed   By: Jules Schick M.D.   On: 02/08/2023 11:41    Microbiology: No results found for this or any previous visit.  Labs: CBC: Recent Labs  Lab 02/08/23 1106  WBC 5.9  HGB 14.8  HCT 44.8  MCV 89.2  PLT 275   Basic Metabolic Panel: Recent Labs  Lab 02/08/23 1106  NA 137  K 4.1  CL 102  CO2 26  GLUCOSE 105*  BUN 15  CREATININE 0.74  CALCIUM 8.9   Liver Function Tests: Recent Labs  Lab 02/08/23 1302  AST 23  ALT 21  ALKPHOS 94  BILITOT 0.5  PROT 6.7  ALBUMIN 3.6   CBG: No results for input(s): "GLUCAP" in the last 168 hours.  Discharge time spent: greater than 30 minutes.  Signed: Vassie Loll, MD [facility] 02/09/2023

## 2023-02-09 NOTE — Evaluation (Signed)
Physical Therapy Evaluation Patient Details Name: Valerie Vasquez MRN: [id number] DOB: [birth year] Today's Date: 02/09/2023  History of Present Illness  Alva Kuenzel  is a 62 y.o. female, with past medical history of fibromyalgia, chronic fatigue syndrome, hyperlipidemia intolerant to statin, hypothyroidism, IBS, endometriosis, she presents to ED secondary to complaints of chest pain, sudden onset, left-sided, 1 hour prior to presentation, pain happened at rest, sharp, constant, report generalized weakness, lightheadedness, as well she does report an episode of slurred speech, tingling numbness around the lips area intermittently over the last couple month, she does report lightheadedness, near syncope for which she saw her cardiologist recently where she was started on Zio patch last Friday.  -In ED significant lab abnormalities, EKG with no acute findings, troponins negative x 2, as well D-dimer is negative, but given her neurological symptoms, [facility] hospitalist consulted to admit due to concern for TIA.   Clinical Impression  Patient functioning near baseline for functional mobility and gait other than slightly slower than normal speed of cadence during ambulation, otherwise demonstrates good return for bed mobility, transfers and ambulating in room/hallways without loss of balance or need for an AD.  Plan:  Patient discharged from physical therapy to care of nursing for ambulation daily as tolerated for length of stay.          If plan is discharge home, recommend the following: Help with stairs or ramp for entrance   Can travel by private vehicle        Equipment Recommendations None recommended by PT  Recommendations for Other Services       Functional Status Assessment Patient has not had a recent decline in their functional status     Precautions / Restrictions Precautions Precautions: None Restrictions Weight Bearing Restrictions: No      Mobility  Bed Mobility Overal bed  mobility: Independent                  Transfers Overall transfer level: Independent                      Ambulation/Gait Ambulation/Gait assistance: Modified independent (Device/Increase time) Gait Distance (Feet): 120 Feet Assistive device: None Gait Pattern/deviations: WFL(Within Functional Limits) Gait velocity: decreased     General Gait Details: grossly WFL with slightly labored cadence with good return for ambulating in room, hallways without loss of balance  Stairs            Wheelchair Mobility     Tilt Bed    Modified Rankin (Stroke Patients Only)       Balance Overall balance assessment: Independent                                           Pertinent Vitals/Pain Pain Assessment Pain Assessment: Faces Faces Pain Scale: Hurts a little bit Pain Location: head, chest, neck Pain Descriptors / Indicators: Headache Pain Intervention(s): Limited activity within patient's tolerance, Monitored during session, Repositioned    Home Living Family/patient expects to be discharged to:: Private residence Living Arrangements: Spouse/significant other Available Help at Discharge: Family;Available PRN/intermittently Type of Home: House Home Access: Stairs to enter Entrance Stairs-Rails: Left Entrance Stairs-Number of Steps: 4   Home Layout: One level Home Equipment: Shower seat - built in      Prior Function Prior Level of Function : Independent/Modified Independent  Mobility Comments: Community ambulator without AD; drives ADLs Comments: Independent     Extremity/Trunk Assessment   Upper Extremity Assessment Upper Extremity Assessment: Defer to OT evaluation    Lower Extremity Assessment Lower Extremity Assessment: Overall WFL for tasks assessed    Cervical / Trunk Assessment Cervical / Trunk Assessment: Normal  Communication   Communication Communication: No apparent difficulties  Cognition  Arousal: Alert Behavior During Therapy: WFL for tasks assessed/performed, Flat affect Overall Cognitive Status: Within Functional Limits for tasks assessed                                          General Comments      Exercises     Assessment/Plan    PT Assessment Patient does not need any further PT services  PT Problem List         PT Treatment Interventions      PT Goals (Current goals can be found in the Care Plan section)  Acute Rehab PT Goals Patient Stated Goal: return home with family to assist PT Goal Formulation: With patient/family Time For Goal Achievement: 02/09/23 Potential to Achieve Goals: Good    Frequency       Co-evaluation PT/OT/SLP Co-Evaluation/Treatment: Yes Reason for Co-Treatment: To address functional/ADL transfers PT goals addressed during session: Mobility/safety with mobility;Balance OT goals addressed during session: ADL's and self-care       AM-PAC PT "6 Clicks" Mobility  Outcome Measure Help needed turning from your back to your side while in a flat bed without using bedrails?: None Help needed moving from lying on your back to sitting on the side of a flat bed without using bedrails?: None Help needed moving to and from a bed to a chair (including a wheelchair)?: None Help needed standing up from a chair using your arms (e.g., wheelchair or bedside chair)?: None Help needed to walk in hospital room?: None Help needed climbing 3-5 steps with a railing? : A Little 6 Click Score: 23    End of Session   Activity Tolerance: Patient tolerated treatment well Patient left: in bed;with family/visitor present Nurse Communication: Mobility status PT Visit Diagnosis: Unsteadiness on feet (R26.81);Other abnormalities of gait and mobility (R26.89);Muscle weakness (generalized) (M62.81)    Time: 1610-9604 PT Time Calculation (min) (ACUTE ONLY): 13 min   Charges:   PT Evaluation $PT Eval Low Complexity: 1 Low PT  Treatments $Therapeutic Activity: 8-22 mins PT General Charges $$ ACUTE PT VISIT: 1 Visit         12:26 PM, 02/09/23 Ocie Bob, MPT Physical Therapist with [facility] [phone] office [phone] mobile phone

## 2023-02-09 NOTE — Progress Notes (Signed)
Patient refused her heparin at this time.

## 2023-02-09 NOTE — Consult Note (Addendum)
I connected with  Valerie Vasquez on 02/09/23 by a video enabled telemedicine application and verified that I am speaking with the correct person using two identifiers.   I discussed the limitations of evaluation and management by telemedicine. The patient expressed understanding and agreed to proceed.  Location of patient: [facility] Location of physician: [facility]  Neurology Consultation Reason for Consult: Slurred speech, dizziness, tingling numbness Referring Physician: Dr. Claudette Stapler  CC: Slurred speech, dizziness, tingling and numbness  History is obtained from: Patient, chart review  HPI: Valerie Vasquez is a 62 y.o. female with past medical history of hyperlipidemia, hypothyroidism, prediabetes, irritable bowel syndrome, fibromyalgia who presented yesterday with chest pain, lightheadedness, tingling/ numbness around the mouth and tongue as well as trouble speaking.  Patient states symptoms started around 9:30 AM during Bible study.  States that she has had symptoms of lightheadedness, tingling/numbness in the mouth and tongue as well as trouble speaking almost every 3 weeks starting end of June.  Usually symptoms last for about 12 hours.  This time her symptoms gradually improved at around 1 AM this morning.  States chest pain has also been going on intermittently and has seen a cardiologist who recommended a heart monitor but all the symptoms were worse this time which is why she does to come to emergency room.  Denies any recent illness, new medications, family history of seizures/epilepsy.  Does report having severe migraines in the past.  States she saw a chiropractor and eventually migraines went away and have not had any more episodes for almost 10 to 15 years.  However continues to have daily headaches.  Also states taking Advil daily for fibromyalgia.  Lastly states she used to be on duloxetine which caused excessive sweating and therefore she weaned herself off of  it.  ROS: All other systems reviewed and negative except as noted in the HPI.   Past Medical History:  Diagnosis Date   Cervical stump prolapse    Chronic fatigue syndrome    Endometriosis    Fibromyalgia    History of chest pain    unspecified chest pain evaluated by cardiologist, dr m. branch note in epic 11-25-2021  no further work-up needed   History of uterine fibroid    Hyperlipidemia    Hypothyroidism    [facility]ed by [facility]   IBS (irritable bowel syndrome)    PONV (postoperative nausea and vomiting)    Pre-diabetes    Prolapse of anterior vaginal wall    SUI (stress urinary incontinence, female)    Wears glasses     Family History  Problem Relation Age of Onset   Hypertension Mother    Atrial fibrillation Mother    Parkinson's disease Father    Hypertension Brother    Stroke Maternal Grandmother    Diabetes Paternal Grandmother     Social History:  reports that she has never smoked. She has never used smokeless tobacco. She reports that she does not currently use alcohol. She reports that she does not use drugs.  Medications Prior to Admission  Medication Sig Dispense Refill Last Dose   aspirin EC 81 MG tablet Take 81 mg by mouth daily. Swallow whole.   02/07/2023 at 1630   B Complex-Folic Acid (B COMPLEX PLUS) TABS Take by mouth daily.   02/07/2023   carboxymethylcellulose (REFRESH PLUS) 0.5 % SOLN Place 2 drops into both eyes daily as needed (dry eyes).   Past Week   Cholecalciferol (VITAMIN D-3 PO) Take  by mouth daily.   02/08/2023   DHA-EPA-Vitamin E (OMEGA-3 COMPLEX PO) Take by mouth daily.   02/08/2023   estradiol (VIVELLE-DOT) 0.0375 MG/24HR Place 1 patch onto the skin 2 (two) times a week.   02/08/2023   Ibuprofen (ADVIL) 200 MG CAPS Take 3-4 capsules by mouth daily as needed (pain).   02/08/2023   metaxalone (SKELAXIN) 800 MG tablet Take 1 tablet by mouth as needed for muscle spasms.   Past Month   Misc Natural Products (ADRENAL PO) Take by  mouth daily. Adrenal support   02/07/2023   Multiple Vitamin (MULTIVITAMIN ADULT PO) Take by mouth daily.   02/07/2023   Multiple Vitamins-Minerals (ZINC PO) Take by mouth daily.   02/08/2023   NONFORMULARY OR COMPOUNDED ITEM daily. Testosterone cream (compound)   02/08/2023   OVER THE COUNTER MEDICATION daily. IODIZYME SUPPLEMENT   02/06/2023   Prasterone, DHEA, (DHEA ADVANCED FORMULA PO) Take by mouth daily.   02/06/2023   Probiotic Product (ULTRAFLORA IMMUNE HEALTH PO) Take by mouth daily.   02/08/2023   progesterone (PROMETRIUM) 100 MG capsule Take 100 mg by mouth daily.   Past Week   QUERCETIN PO Take by mouth daily.   02/08/2023   Saccharomyces boulardii (DAILY PROBIOTIC SUPPLEMENT PO) Take by mouth daily.   02/08/2023   Specialty Vitamins Products (BRAIN MIGHT/DHA & CO Q10) TABS Take by mouth daily at 12 noon. BRAIN SUPPORT   02/07/2023   thyroid (ARMOUR THYROID) 30 MG tablet Take 60 mg by mouth daily before breakfast.   02/08/2023   TYROSINE PO Take by mouth daily. W/  IODINE   02/08/2023   UBIQUINOL LIPOSOMAL PO Take by mouth daily.   02/07/2023   vitamin k 100 MCG tablet Take 100 mcg by mouth daily.   02/08/2023   DULoxetine (CYMBALTA) 30 MG capsule Take 30 mg by mouth daily.   02/06/2023      Exam: Current vital signs: BP 102/61 (BP Location: Left Arm)   Pulse 67   Temp 98.2 F (36.8 C) (Oral)   Resp 17   Ht 5\' 1"  (1.549 m)   Wt 83.9 kg   SpO2 99%   BMI 34.96 kg/m  Vital signs in last 24 hours: Temp:  [97.9 F (36.6 C)-98.7 F (37.1 C)] 98.2 F (36.8 C) (10/10 1215) Pulse Rate:  [64-81] 67 (10/10 1215) Resp:  [12-18] 17 (10/10 1215) BP: (102-153)/(55-94) 102/61 (10/10 1215) SpO2:  [96 %-100 %] 99 % (10/10 1215)   Physical Exam  Constitutional: Appears well-developed and well-nourished.  Psych: Affect appropriate to situation Neuro: AOx3, no aphasia, cranial nerves II to XII appear grossly intact except some tingling around the lips, antigravity strength without drift in upper  extremities, sensation intact to light touch, FTN intact bilaterally  I have reviewed labs in epic and the results pertinent to this consultation are: CBC:  Recent Labs  Lab 02/08/23 1106  WBC 5.9  HGB 14.8  HCT 44.8  MCV 89.2  PLT 275    Basic Metabolic Panel:  Lab Results  Component Value Date   NA 137 02/08/2023   K 4.1 02/08/2023   CO2 26 02/08/2023   GLUCOSE 105 (H) 02/08/2023   BUN 15 02/08/2023   CREATININE 0.74 02/08/2023   CALCIUM 8.9 02/08/2023   GFRNONAA >60 02/08/2023   GFRAA >60 11/10/2019   Lipid Panel:  Lab Results  Component Value Date   LDLCALC 105 (H) 02/09/2023   HgbA1c:  Lab Results  Component Value Date   HGBA1C  5.8 (H) 02/08/2023   Urine Drug Screen: No results found for: "LABOPIA", "COCAINSCRNUR", "LABBENZ", "AMPHETMU", "THCU", "LABBARB"  Alcohol Level No results found for: "ETH"   I have reviewed the images obtained:   MRI brain with and without contrast 02/08/2023: Nos acute abnormality.  MRA head without contrast 02/08/2023: Unremarkable  Doppler done as an outpatient 01/20/2023, significant for minimal homogeneous plaque, no hemodynamically significant stenosis by duplex criteria.   ASSESSMENT/PLAN: 62 year old female with transient lightheadedness, tingling numbness and trouble speaking since end of June, happening every few weeks lasting for about 12 hours on average.  Also reports daily headache for which she takes Advil.  Transient neurological symptoms -Differentials include migraine with aura versus POTS versus psychogenic  Recommendations: -Due to history of fibromyalgia, prior migraines and current daily headaches as well as will concern but low suspicion for seizures, discussed about starting gabapentin 300 mg twice daily.  Patient is in agreement. -EEG ordered and pending.  Will not start any other antiseizure medications unless EEG shows definite epileptiform abnormality -Will check ionized calcium as well as folic acid due  to transient paresthesias -Orthostatic vital signs were tested and did not show any evidence of orthostasis today.  However patient had already received IV fluids prior to this.  Might consider tilt table testing as an outpatient if symptoms persist -Unlikely that the symptoms are due to TIA because of the stereotypical events multiple times -TTE pending -Has heart monitor which will be interrogated at the end of 2 weeks (was placed last Friday) -Discussed plan in detail with patient, husband at bedside and Dr. Hyacinth Meeker via secure chat -Has follow-up with [facility] neurology stated scheduled in few weeks  Thank you for allowing [place] to participate in the care of this patient. If you have any further questions, please contact  me or neurohospitalist.   Lindie Spruce [facility] neuro[facility]ist

## 2023-02-09 NOTE — Plan of Care (Signed)
  Problem: Education: Goal: Knowledge of the prescribed therapeutic regimen will improve Outcome: Progressing Goal: Understanding of sexual limitations or changes related to disease process or condition will improve Outcome: Progressing Goal: Individualized Educational Video(s) Outcome: Progressing   Problem: Self-Concept: Goal: Communication of feelings regarding changes in body function or appearance will improve Outcome: Progressing   Problem: Skin Integrity: Goal: Demonstration of wound healing without infection will improve Outcome: Progressing   Problem: Education: Goal: Knowledge of General Education information will improve Description: Including pain rating scale, medication(s)/side effects and non-pharmacologic comfort measures Outcome: Progressing   Problem: Health Behavior/Discharge Planning: Goal: Ability to manage health-related needs will improve Outcome: Progressing   Problem: Clinical Measurements: Goal: Ability to maintain clinical measurements within normal limits will improve Outcome: Progressing Goal: Will remain free from infection Outcome: Progressing Goal: Diagnostic test results will improve Outcome: Progressing Goal: Respiratory complications will improve Outcome: Progressing Goal: Cardiovascular complication will be avoided Outcome: Progressing   Problem: Activity: Goal: Risk for activity intolerance will decrease Outcome: Progressing   Problem: Nutrition: Goal: Adequate nutrition will be maintained Outcome: Progressing   Problem: Coping: Goal: Level of anxiety will decrease Outcome: Progressing   Problem: Elimination: Goal: Will not experience complications related to bowel motility Outcome: Progressing Goal: Will not experience complications related to urinary retention Outcome: Progressing   Problem: Pain Managment: Goal: General experience of comfort will improve Outcome: Progressing   Problem: Safety: Goal: Ability to remain  free from injury will improve Outcome: Progressing   Problem: Skin Integrity: Goal: Risk for impaired skin integrity will decrease Outcome: Progressing   Problem: Education: Goal: Knowledge of disease or condition will improve Outcome: Progressing Goal: Knowledge of secondary prevention will improve (MUST DOCUMENT ALL) Outcome: Progressing Goal: Knowledge of patient specific risk factors will improve Loraine Leriche N/A or DELETE if not current risk factor) Outcome: Progressing   Problem: Ischemic Stroke/TIA Tissue Perfusion: Goal: Complications of ischemic stroke/TIA will be minimized Outcome: Progressing   Problem: Coping: Goal: Will verbalize positive feelings about self Outcome: Progressing Goal: Will identify appropriate support needs Outcome: Progressing   Problem: Health Behavior/Discharge Planning: Goal: Ability to manage health-related needs will improve Outcome: Progressing Goal: Goals will be collaboratively established with patient/family Outcome: Progressing   Problem: Self-Care: Goal: Ability to participate in self-care as condition permits will improve Outcome: Progressing Goal: Verbalization of feelings and concerns over difficulty with self-care will improve Outcome: Progressing Goal: Ability to communicate needs accurately will improve Outcome: Progressing   Problem: Nutrition: Goal: Risk of aspiration will decrease Outcome: Progressing Goal: Dietary intake will improve Outcome: Progressing

## 2023-02-11 LAB — CALCIUM, IONIZED: Calcium, Ionized, Serum: 5 mg/dL (ref 4.5–5.6)

## 2023-02-15 ENCOUNTER — Telehealth: Payer: Self-pay | Admitting: Cardiology

## 2023-02-15 NOTE — Telephone Encounter (Signed)
Patient is requesting call back to discuss monitor that she was wearing and to discuss if she still needs to wear this for a longer time. She had to remove in hospital and they did other tests. She is requesting call back to discuss her next steps. Please advise.

## 2023-02-15 NOTE — Telephone Encounter (Signed)
Spoke with pt who states the monitor was placed on Friday and removed on Wednesday evening for a MRI. She did go ahead and mail the monitor back. Does she need another monitor? Please advise.

## 2023-02-16 NOTE — Telephone Encounter (Signed)
We will see what data the current monitor has for now  Dominga Ferry MD

## 2023-02-16 NOTE — Telephone Encounter (Signed)
Left message that Dr.Branch wants to see the results of this monitor first .

## 2023-02-16 NOTE — Telephone Encounter (Signed)
Returned call to pt. No answer. No voice mail.

## 2023-02-20 ENCOUNTER — Other Ambulatory Visit: Payer: BC Managed Care – PPO

## 2023-03-03 NOTE — Telephone Encounter (Signed)
Patient called to follow-up on heart monitor results.

## 2023-03-23 ENCOUNTER — Ambulatory Visit: Payer: BC Managed Care – PPO | Admitting: Neurology

## 2023-03-23 ENCOUNTER — Encounter: Payer: Self-pay | Admitting: Neurology

## 2023-03-23 VITALS — BP 131/81 | HR 72 | Ht 61.0 in | Wt 183.0 lb

## 2023-03-23 DIAGNOSIS — R42 Dizziness and giddiness: Secondary | ICD-10-CM

## 2023-03-23 DIAGNOSIS — R55 Syncope and collapse: Secondary | ICD-10-CM | POA: Diagnosis not present

## 2023-03-23 DIAGNOSIS — R4781 Slurred speech: Secondary | ICD-10-CM | POA: Diagnosis not present

## 2023-03-23 MED ORDER — SUMATRIPTAN SUCCINATE 50 MG PO TABS: 50.0000 mg | ORAL_TABLET | ORAL | 0 refills | Status: AC | PRN

## 2023-03-23 MED ORDER — CITALOPRAM HYDROBROMIDE 10 MG PO TABS: 10.0000 mg | ORAL_TABLET | Freq: Every day | ORAL | 3 refills | Status: AC

## 2023-03-23 NOTE — Patient Instructions (Addendum)
Continue current medications  Start Celexa 10 mg, patient will call us for updates  3 days ambulatory EEG  Trial of sumatriptan  Follow up in 6 to 8 months

## 2023-03-23 NOTE — Progress Notes (Signed)
[facility]  PATIENT: Valerie Vasquez DOB: June 05, 1960  REQUESTING CLINICIAN: Donetta Potts, MD HISTORY FROM: Patient/Chart review  REASON FOR VISIT: Episode of slurred speech, dizziness, headaches    HISTORICAL  CHIEF COMPLAINT:  Chief Complaint  Patient presents with   New Patient (Initial Visit)    Rm 12. Patient reports having episodes since June, one was with lips changing and feeling intoxicated, reports passing out once and changes in speech and sometimes head will hurt, has seen cardio and hospital visit for it, had EEG and imaging done during the hospital visit    HISTORY OF PRESENT ILLNESS:  This is a 62 year old woman past medical history including hyperlipidemia, hypothyroidism, chronic migraine, chronic fatigue syndrome and fibromyalgia who is presenting with complaint of slurred speech, dizziness, lightheadedness and a syncopal episode in June.  Patient reports episode started in June, when she felt lightheaded, and her tongue and lipsl felt loose, she did have slurred speech at that time.  She attempted to walk and fell down.  She reports continuing to having these episodes. On October 9 she have a severe episode associated with chest pain therefore she presented to the ED.  In the ED her workup including MRI brain, MRI a head, EEG, echo were all unrevealing.  Diagnosed with possible possible TIA.  She presents today for follow-up, reports since discharge from the hospital she had additional episode again they can start with the slurred speech, sometimes they can start with generalized fatigue and dizziness.  Some of these episodes are associated with headaches and others are not associated with headache.  On average, she has these episode once a week. She does have a history of migraine headaches, but the headaches are well-controlled with acupuncture and massages she is not on any medication for headaches.  She denies any previous history of  convulsion.   Brief Hospital admission course: As per H&P written by Dr. Randol Vasquez on 02/08/2023  Valerie Vasquez  is a 62 y.o. female, with past medical history of fibromyalgia, chronic fatigue syndrome, hyperlipidemia intolerant to statin, hypothyroidism, IBS, endometriosis, she presents to ED secondary to complaints of chest pain, sudden onset, left-sided, 1 hour prior to presentation, pain happened at rest, sharp, constant, report generalized weakness, lightheadedness, as well she does report an episode of slurred speech, tingling numbness around the lips area intermittently over the last couple month, she does report lightheadedness, near syncope for which she saw her cardiologist recently where she was started on Zio patch last Friday. -In ED significant lab abnormalities, EKG with no acute findings, troponins negative x 2, as well D-dimer is negative, but given her neurological symptoms, [facility] hospitalist consulted to admit due to concern for TIA.   Assessment and Plan: Slurred speech/dizziness and lightheadedness; there was concern for TIA -Workup negative for acute stroke or hemorrhagic findings -No significant large vessel occlusion appreciated and good patency of patient's vertebral arteries. -2D echo without wall motion abnormalities and with preserved ejection fraction -EEG demonstrating no epileptiform wave abnormalities -Case discussed with neurology service who recommended continue aspirin for secondary prevention; start patient on Neurontin twice a day with underlying concern for most likely migraine triggering cause for patient's symptoms. -Patient has been started on Lipitor every other day 20 mg due to elevated LDL. (In effort for secondary prevention).  OTHER MEDICAL CONDITIONS: Fibromyalgia, chronic fatigue, chronic migraines, hyperlipidemia    REVIEW OF SYSTEMS: Full 14 system review of systems performed and negative with exception of: As noted in the HPI  ALLERGIES: Allergies  Allergen Reactions   Codeine     High doses cause severe chest tightness    Diazepam     High doses cause sever chest tightness   Gluten Meal     Gluten free diet   Morphine     High doses cause severe chest tightness   Topiramate Other (See Comments)    Per pt suicidal    HOME MEDICATIONS: Outpatient Medications Prior to Visit  Medication Sig Dispense Refill   levothyroxine (SYNTHROID) 75 MCG tablet Take 75 mcg by mouth daily.     aspirin EC 81 MG tablet Take 81 mg by mouth daily. Swallow whole.     atorvastatin (LIPITOR) 20 MG tablet Take 1 tablet (20 mg total) by mouth every other day. (Patient not taking: Reported on 03/23/2023) 30 tablet 1   B Complex-Folic Acid (B COMPLEX PLUS) TABS Take by mouth daily.     butalbital-acetaminophen-caffeine (FIORICET) 50-325-40 MG tablet Take 1 tablet by mouth every 8 (eight) hours as needed for headache or migraine. 15 tablet 0   carboxymethylcellulose (REFRESH PLUS) 0.5 % SOLN Place 2 drops into both eyes daily as needed (dry eyes).     Cholecalciferol (VITAMIN D-3 PO) Take by mouth daily.     DHA-EPA-Vitamin E (OMEGA-3 COMPLEX PO) Take by mouth daily. (Patient not taking: Reported on 03/23/2023)     estradiol (VIVELLE-DOT) 0.0375 MG/24HR Place 1 patch onto the skin 2 (two) times a week. (Patient not taking: Reported on 03/23/2023)     gabapentin (NEURONTIN) 300 MG capsule Take 1 capsule (300 mg total) by mouth 2 (two) times daily. (Patient not taking: Reported on 03/23/2023) 60 capsule 0   Misc Natural Products (ADRENAL PO) Take by mouth daily. Adrenal support     Multiple Vitamins-Minerals (ZINC PO) Take by mouth daily.     pantoprazole (PROTONIX) 40 MG tablet Take 1 tablet (40 mg total) by mouth daily. 30 tablet 1   progesterone (PROMETRIUM) 100 MG capsule Take 100 mg by mouth daily. (Patient not taking: Reported on 03/23/2023)     Saccharomyces boulardii (DAILY PROBIOTIC SUPPLEMENT PO) Take by mouth daily.      Specialty Vitamins Products (BRAIN MIGHT/DHA & CO Q10) TABS Take by mouth daily at 12 noon. BRAIN SUPPORT     thyroid (ARMOUR THYROID) 30 MG tablet Take 60 mg by mouth daily before breakfast.     vitamin k 100 MCG tablet Take 100 mcg by mouth daily.     No facility-administered medications prior to visit.    PAST MEDICAL HISTORY: Past Medical History:  Diagnosis Date   Cervical stump prolapse    Chronic fatigue syndrome    Endometriosis    Fibromyalgia    History of chest pain    unspecified chest pain evaluated by cardiologist, dr m. branch note in epic 11-25-2021  no further work-up needed   History of uterine fibroid    Hyperlipidemia    Hypothyroidism    followed by [facility]   IBS (irritable bowel syndrome)    PONV (postoperative nausea and vomiting)    Pre-diabetes    Prolapse of anterior vaginal wall    SUI (stress urinary incontinence, female)    Wears glasses     PAST SURGICAL HISTORY: Past Surgical History:  Procedure Laterality Date   APPENDECTOMY  2007   at same time of D & C    BLADDER SUSPENSION N/A 03/16/2022   Procedure: TRANSVAGINAL TAPE (TVT) PROCEDURE;  Surgeon: Marguerita Beards, MD;  Location: [facility];  Service: Gynecology;  Laterality: N/A;   BREAST REDUCTION SURGERY Bilateral 06/2012   CYSTOSCOPY N/A 03/16/2022   Procedure: CYSTOSCOPY;  Surgeon: Marguerita Beards, MD;  Location: [facility];  Service: Gynecology;  Laterality: N/A;   DILATATION & CURETTAGE/HYSTEROSCOPY WITH MYOSURE  08/17/2007   in [place];  myomectomy   HYSTEROSCOPY WITH NOVASURE  07/29/2005   in [place];  NOVASURE ENDOMETRIAL ABLATION AND   LAPAROSCOPIC LEFT OVARIN CYSTECTOMY   LAPAROSCOPIC CHOLECYSTECTOMY  1994   LAPAROSCOPIC SUPRACERVICAL HYSTERECTOMY Bilateral 12/21/2007   in [place];   W/  BILATERAL SALPINOOPHORECTOMY   LAPAROSCOPIC TUBAL LIGATION Bilateral 1990   in [place]   PELVIC LAPAROSCOPY   01/15/2003   in [place];   LAPAROSCOPIC LEFT SALPINGECTOMY/  RIGHT PARTIAL SALPINGECOTMY/  LYSIS ADHESIONS/  AND D&C HYSTEROSCOPY   ROBOTIC ASSISTED LAPAROSCOPIC SACROCOLPOPEXY N/A 03/16/2022   Procedure: XI ROBOTIC ASSISTED LAPAROSCOPIC SACROCOLPOPEXY;  Surgeon: Marguerita Beards, MD;  Location: [facility];  Service: Gynecology;  Laterality: N/A;   TRACHELECTOMY N/A 03/16/2022   Procedure: TRACHELECTOMY;  Surgeon: Marguerita Beards, MD;  Location: [facility];  Service: Gynecology;  Laterality: N/A;  total time requested is 3.5 hours    FAMILY HISTORY: Family History  Problem Relation Age of Onset   Hypertension Mother    Atrial fibrillation Mother    Parkinson's disease Father    Hypertension Brother    Stroke Maternal Grandmother    Diabetes Paternal Grandmother     SOCIAL HISTORY: Social History   Socioeconomic History   Marital status: Married    Spouse name: Not on file   Number of children: Not on file   Years of education: Not on file   Highest education level: Not on file  Occupational History   Not on file  Tobacco Use   Smoking status: Never   Smokeless tobacco: Never  Vaping Use   Vaping status: Never Used  Substance and Sexual Activity   Alcohol use: Not Currently   Drug use: Never   Sexual activity: Yes    Birth control/protection: Surgical    Comment: Tubal/TVH/first intercourse >, less than 5 partners  Other Topics Concern   Not on file  Social History Narrative   Not on file   Social Determinants of Health   Financial Resource Strain: Not on file  Food Insecurity: No Food Insecurity (02/08/2023)   Hunger Vital Sign    Worried About Running Out of Food in the Last Year: Never true    Ran Out of Food in the Last Year: Never true  Transportation Needs: No Transportation Needs (02/08/2023)   PRAPARE - Administrator, Civil Service (Medical): No    Lack of Transportation (Non-Medical): No  Physical  Activity: Not on file  Stress: Not on file  Social Connections: Not on file  Intimate Partner Violence: Not At Risk (02/08/2023)   Humiliation, Afraid, Rape, and Kick questionnaire    Fear of Current or Ex-Partner: No    Emotionally Abused: No    Physically Abused: No    Sexually Abused: No     PHYSICAL EXAM  GENERAL EXAM/CONSTITUTIONAL: Vitals:  Vitals:   03/23/23 1044  BP: 131/81  Pulse: 72  Weight: 183 lb (83 kg)  Height: 5\' 1"  (1.549 m)   Body mass index is 34.58 kg/m. Wt Readings from Last 3 Encounters:  03/23/23 183 lb (83 kg)  02/08/23 185 lb (83.9 kg)  02/03/23  189 lb (85.7 kg)   Patient is in no distress; well developed, nourished and groomed; neck is supple  MUSCULOSKELETAL: Gait, strength, tone, movements noted in Neurologic exam below  NEUROLOGIC: MENTAL STATUS:      No data to display         awake, alert, oriented to person, place and time recent and remote memory intact normal attention and concentration language fluent, comprehension intact, naming intact fund of knowledge appropriate  CRANIAL NERVE:  2nd, 3rd, 4th, 6th - Visual fields full to confrontation, extraocular muscles intact, no nystagmus 5th - facial sensation symmetric 7th - facial strength symmetric 8th - hearing intact 9th - palate elevates symmetrically, uvula midline 11th - shoulder shrug symmetric 12th - tongue protrusion midline  MOTOR:  normal bulk and tone, full strength in the BUE, BLE  SENSORY:  normal and symmetric to light touch  COORDINATION:  finger-nose-finger, fine finger movements normal  REFLEXES:  deep tendon reflexes present and symmetric  GAIT/STATION:  normal   DIAGNOSTIC DATA (LABS, IMAGING, TESTING) - I reviewed patient records, labs, notes, testing and imaging myself where available.  Lab Results  Component Value Date   WBC 5.9 02/08/2023   HGB 14.8 02/08/2023   HCT 44.8 02/08/2023   MCV 89.2 02/08/2023   PLT 275 02/08/2023       Component Value Date/Time   NA 137 02/08/2023 1106   K 4.1 02/08/2023 1106   CL 102 02/08/2023 1106   CO2 26 02/08/2023 1106   GLUCOSE 105 (H) 02/08/2023 1106   BUN 15 02/08/2023 1106   CREATININE 0.74 02/08/2023 1106   CALCIUM 8.9 02/08/2023 1106   PROT 6.7 02/08/2023 1302   ALBUMIN 3.6 02/08/2023 1302   AST 23 02/08/2023 1302   ALT 21 02/08/2023 1302   ALKPHOS 94 02/08/2023 1302   BILITOT 0.5 02/08/2023 1302   GFRNONAA >60 02/08/2023 1106   GFRAA >60 11/10/2019 2124   Lab Results  Component Value Date   CHOL 171 02/09/2023   HDL 38 (L) 02/09/2023   LDLCALC 105 (H) 02/09/2023   TRIG 139 02/09/2023   CHOLHDL 4.5 02/09/2023   Lab Results  Component Value Date   HGBA1C 5.8 (H) 02/08/2023   Lab Results  Component Value Date   VITAMINB12 643 02/09/2023   Lab Results  Component Value Date   TSH 0.646 02/09/2023    MRI Brain 02/08/2023: No infarct or other explanation for symptoms. Unremarkable appearance of the brain.   Intracranial MRA 02/08/2023: Unremarkable.  Routine EEG 02/09/2023 Normal   ASSESSMENT AND PLAN  62 y.o. year old female with hyperlipidemia, hypothyroidism, chronic migraine, chronic fatigue syndrome and fibromyalgia who is presenting with event consisting of dizziness feeling like he is intoxicated, slurred speech, feeling that her lips and tongue are loose and sometimes associated with headaches.  So far her workup has been unrevealing.  Differential include seizures versus vestibular migraine since she does have a history of chronic migraines vs. functional.  Her routine EEG was normal, we will obtain a 3-days ambulatory EEG and I gave patient sumatriptan to use at the start of the episode.  Due to her ongoing anxiety, we will also start her on low-dose Celexa and increase if indicated.  I will contact her to go over the result otherwise I will see her in 6 to 8 months for follow-up.  She was understanding.   1. Syncope, unspecified syncope type    2. Dizziness   3. Slurred speech  Patient Instructions  Continue current medications  Start Celexa 10 mg, patient will call us for updates  3 days ambulatory EEG  Trial of sumatriptan  Follow up in 6 to 8 months   Orders Placed This Encounter  Procedures   AMBULATORY EEG    Meds ordered this encounter  Medications   citalopram (CELEXA) 10 MG tablet    Sig: Take 1 tablet (10 mg total) by mouth daily.    Dispense:  30 tablet    Refill:  3   SUMAtriptan (IMITREX) 50 MG tablet    Sig: Take 1 tablet (50 mg total) by mouth every 2 (two) hours as needed. May repeat in 2 hours if headache persists or recurs.    Dispense:  10 tablet    Refill:  0    Return in about 6 months (around 09/20/2023).    Windell Norfolk, MD 03/23/2023, 11:39 AM  [facility] [address], [address] [place], [place] [address] [phone]

## 2023-04-23 ENCOUNTER — Encounter: Payer: Self-pay | Admitting: Neurology

## 2023-05-05 ENCOUNTER — Ambulatory Visit: Payer: BC Managed Care – PPO | Attending: Student | Admitting: Student

## 2023-05-05 ENCOUNTER — Encounter: Payer: Self-pay | Admitting: Student

## 2023-05-05 VITALS — BP 124/76 | HR 86 | Ht 61.0 in | Wt 190.0 lb

## 2023-05-05 DIAGNOSIS — E782 Mixed hyperlipidemia: Secondary | ICD-10-CM | POA: Diagnosis not present

## 2023-05-05 DIAGNOSIS — R079 Chest pain, unspecified: Secondary | ICD-10-CM

## 2023-05-05 DIAGNOSIS — R55 Syncope and collapse: Secondary | ICD-10-CM

## 2023-05-05 NOTE — Progress Notes (Signed)
 Cardiology Office Note    Date:  05/05/2023  ID:  Valerie Vasquez, DOB [birth year], MRN [id number] Cardiologist: Alvan Carrier, MD    History of Present Illness:    Valerie Vasquez is a 63 y.o. female with past medical history of atypical chest pain, HLD, hypothyroidism, fibromyalgia and prior syncope who presents to the office today for 51-month follow-up.  She was examined by Dr. Alvan in 01/2023 and had previously experienced a syncopal episode in June and had associated difficulty with word finding and lightheadedness at that time. It was recommend to obtain an echocardiogram and 14-day Zio patch for further assessment in the setting of syncope and possible TIA.  Her echocardiogram showed a preserved EF of 55 to 60% with no regional wall motion abnormalities. She did have grade 1 diastolic dysfunction, normal RV function and no significant valve abnormalities. Event monitor showed predominantly normal sinus rhythm with an average heart rate of 76 bpm. She did have rare PAC's and PVC's but overall less than 1% burden with no significant arrhythmias.  In the interim, she was admitted to [facility] later that month for evaluation of slurred speech and dizziness. MRI Brain showed no acute intracranial abnormalities and EEG was unrevealing. She was evaluated by Neurology and her event was overall felt to be atypical for a TIA. Was continued on ASA for secondary prevention and was also started on Lipitor 20 mg daily. Neurology did recommend starting Neurontin  given concerns that migraines could be triggering her episodes.  In talking with the patient today, she denies any recurrent events since her hospitalization in 01/2023.  Reports that she did stop Neurontin  and also stopped Lipitor as she had GI side effects with these. Also reports having myalgias with multiple statins in the past which were previously prescribed by her PCP. She denies any recent exertional chest pain or palpitations. Reports having  chest discomfort at rest which she feels like is due to her fibromyalgia as this improves with the use of muscle relaxers or a heating pad. Pain can last for hours at a time and has been occurring for the past year. Does experience occasional palpitations and no known precipitating events. She has home exercise equipment and tries to use this on a daily basis. No recent symptoms while exercising.  Studies Reviewed:   EKG: EKG is not ordered today.   Echocardiogram: 01/2023 IMPRESSIONS     1. Left ventricular ejection fraction, by estimation, is 55 to 60%. The  left ventricle has normal function. The left ventricle has no regional  wall motion abnormalities. Left ventricular diastolic parameters are  consistent with Grade I diastolic  dysfunction (impaired relaxation).   2. Right ventricular systolic function is normal. The right ventricular  size is normal. Tricuspid regurgitation signal is inadequate for assessing  PA pressure.   3. Left atrial size was mildly dilated.   4. The mitral valve is normal in structure. No evidence of mitral valve  regurgitation. No evidence of mitral stenosis.   5. The aortic valve is tricuspid. Aortic valve regurgitation is not  visualized. No aortic stenosis is present.   6. The inferior vena cava is normal in size with greater than 50%  respiratory variability, suggesting right atrial pressure of 3 mmHg.   Comparison(s): No prior Echocardiogram.    Event Monitor: 01/2023   8 day monitor   Rare supraventricular ectopy in the form of isoalted PACs, couplets, triplets   Rare ventricular ectopy in the form of isolated PVCs  Reported symptoms correlated with normal sinus rhythm   No significant arrhythmias     Patch Wear Time:  8 days and 8 hours (2024-10-04T10:53:15-0400 to 2024-10-12T19:42:39-0400)   Patient had a min HR of 50 bpm, max HR of 144 bpm, and avg HR of 76 bpm. Predominant underlying rhythm was Sinus Rhythm. Isolated SVEs were rare  (<1.0%), SVE Couplets were rare (<1.0%), and SVE Triplets were rare (<1.0%). Isolated VEs were rare (<1.0%),  and no VE Couplets or VE Triplets were present.   Physical Exam:   VS:  BP 124/76   Pulse 86   Ht 5' 1 (1.549 m)   Wt 190 lb (86.2 kg)   SpO2 96%   BMI 35.90 kg/m    Wt Readings from Last 3 Encounters:  05/05/23 190 lb (86.2 kg)  03/23/23 183 lb (83 kg)  02/08/23 185 lb (83.9 kg)     GEN: Well nourished, well developed female appearing in no acute distress NECK: No JVD; No carotid bruits CARDIAC: RRR, no murmurs, rubs, gallops RESPIRATORY:  Clear to auscultation without rales, wheezing or rhonchi  ABDOMEN: Appears non-distended. No obvious abdominal masses. EXTREMITIES: No clubbing or cyanosis. No pitting edema.  Distal pedal pulses are 2+ bilaterally.   Assessment and Plan:   1. Atypical Chest Pain - Her episodes of chest pain typically occur at rest or with minimal activity and can last for several hours and improve with the a muscle relaxer or heating pad. No specific exertional symptoms when exercising.  Overall, atypical for angina. We reviewed warning signs to monitor for. If she developed exertional symptoms, would recommend a Coronary CTA for further assessment.  2. Prior Syncopal Episodes - Unclear etiology but possibly felt to be due to migraine headaches in the past and followed by Neurology. Cardiac workup thus far has been reassuring with echocardiogram showing a preserved EF with no wall motion abnormalities and no significant valve abnormalities. Event monitor also showed predominantly normal sinus rhythm with no significant arrhythmias. She does report having occasional palpitations as discussed above and we discussed the option of a Kardia monitor and she will consider this. I encouraged her to send any concerning strips via MyChart for review as she does have a family history of atrial fibrillation in both her mother and brother.  3. HLD - LDL was at 105  in 01/2023 and she was started on Atorvastatin  20 mg daily. Reports being intolerant to multiple statins. We reviewed the option of Zetia but she wishes to focus on exercise and dietary changes at this time.  Signed, Laymon CHRISTELLA Qua, PA-C

## 2023-05-05 NOTE — Patient Instructions (Signed)
 Medication Instructions:  Your physician recommends that you continue on your current medications as directed. Please refer to the Current Medication list given to you today.   Labwork: None today  Testing/Procedures: None today  Follow-Up: 1 year with Brittany Strader,PA-C, or Dr.Branch  Any Other Special Instructions Will Be Listed Below (If Applicable).  If you need a refill on your cardiac medications before your next appointment, please call your pharmacy.

## 2023-06-05 ENCOUNTER — Ambulatory Visit: Payer: BC Managed Care – PPO | Admitting: Cardiology

## 2023-06-15 ENCOUNTER — Encounter: Payer: Self-pay | Admitting: Neurology

## 2023-06-15 ENCOUNTER — Other Ambulatory Visit: Payer: Self-pay | Admitting: Neurology

## 2023-06-15 MED ORDER — PROPRANOLOL HCL ER 60 MG PO CP24
60.0000 mg | ORAL_CAPSULE | Freq: Every day | ORAL | 6 refills | Status: AC
Start: 2023-06-15 — End: 2024-01-11

## 2023-10-05 ENCOUNTER — Encounter: Payer: Self-pay | Admitting: Neurology

## 2023-10-24 ENCOUNTER — Ambulatory Visit: Payer: BC Managed Care – PPO | Admitting: Neurology

## 2024-01-22 LAB — HM MAMMOGRAPHY

## 2024-01-24 ENCOUNTER — Encounter: Payer: Self-pay | Admitting: Obstetrics and Gynecology

## 2024-01-25 ENCOUNTER — Ambulatory Visit: Payer: Self-pay | Admitting: Obstetrics and Gynecology

## 2024-02-05 ENCOUNTER — Ambulatory Visit: Payer: Self-pay | Admitting: Obstetrics and Gynecology

## 2024-02-13 LAB — HM MAMMOGRAPHY

## 2024-02-14 ENCOUNTER — Encounter: Payer: Self-pay | Admitting: Obstetrics and Gynecology

## 2024-02-16 ENCOUNTER — Telehealth: Payer: Self-pay | Admitting: *Deleted

## 2024-02-16 ENCOUNTER — Other Ambulatory Visit: Payer: Self-pay | Admitting: Obstetrics and Gynecology

## 2024-02-16 MED ORDER — LORAZEPAM 0.5 MG PO TABS: 0.5000 mg | ORAL_TABLET | Freq: Three times a day (TID) | ORAL | 0 refills | Status: AC

## 2024-02-16 NOTE — Telephone Encounter (Signed)
 Spoke with Katie at [facility]. Patient was scheduled for breast biopsy on 02/13/24, unable to complete. Patient was anxious and passed out. Patient has been rescheduled for 02/23/24. Katie requested provider review for anti-anxiety medication prior to procedure.   Advised I will review with covering provider and f/u with patient regarding recommendations. Patient will need a driver. Katie call back number is [phone]  Dr. Glennon -please advise.

## 2024-02-20 ENCOUNTER — Ambulatory Visit: Admitting: Obstetrics and Gynecology

## 2024-02-23 LAB — HM MAMMOGRAPHY

## 2024-03-04 ENCOUNTER — Ambulatory Visit: Payer: Self-pay | Admitting: Obstetrics and Gynecology

## 2024-03-04 ENCOUNTER — Encounter: Payer: Self-pay | Admitting: Obstetrics and Gynecology
